# Patient Record
Sex: Female | Born: 1990 | Race: White | Hispanic: No | Marital: Single | Smoking: Former smoker
Health system: Southern US, Community
[De-identification: ages and names within clinical notes are randomized; demographics above are authoritative.]

## PROBLEM LIST (undated history)

## (undated) DIAGNOSIS — I10 Essential (primary) hypertension: Secondary | ICD-10-CM

## (undated) DIAGNOSIS — F909 Attention-deficit hyperactivity disorder, unspecified type: Secondary | ICD-10-CM

## (undated) DIAGNOSIS — N946 Dysmenorrhea, unspecified: Secondary | ICD-10-CM

## (undated) DIAGNOSIS — F419 Anxiety disorder, unspecified: Secondary | ICD-10-CM

## (undated) DIAGNOSIS — T8859XA Other complications of anesthesia, initial encounter: Secondary | ICD-10-CM

## (undated) DIAGNOSIS — J45909 Unspecified asthma, uncomplicated: Secondary | ICD-10-CM

## (undated) DIAGNOSIS — Z793 Long term (current) use of hormonal contraceptives: Secondary | ICD-10-CM

## (undated) DIAGNOSIS — Z9889 Other specified postprocedural states: Secondary | ICD-10-CM

## (undated) DIAGNOSIS — Z87442 Personal history of urinary calculi: Secondary | ICD-10-CM

## (undated) DIAGNOSIS — E349 Endocrine disorder, unspecified: Secondary | ICD-10-CM

## (undated) DIAGNOSIS — F32A Depression, unspecified: Secondary | ICD-10-CM

## (undated) DIAGNOSIS — D649 Anemia, unspecified: Secondary | ICD-10-CM

## (undated) DIAGNOSIS — R112 Nausea with vomiting, unspecified: Secondary | ICD-10-CM

## (undated) HISTORY — DX: Dysmenorrhea, unspecified: N94.6

## (undated) HISTORY — PX: TONSILLECTOMY: SUR1361

## (undated) HISTORY — DX: Attention-deficit hyperactivity disorder, unspecified type: F90.9

## (undated) HISTORY — DX: Endocrine disorder, unspecified: E34.9

## (undated) HISTORY — DX: Long term (current) use of hormonal contraceptives: Z79.3

## (undated) HISTORY — DX: Anxiety disorder, unspecified: F41.9

## (undated) HISTORY — DX: Nausea with vomiting, unspecified: R11.2

---
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
## 2012-04-25 ENCOUNTER — Ambulatory Visit: Payer: Self-pay | Admitting: Unknown Physician Specialty

## 2012-12-27 HISTORY — PX: MENISCUS REPAIR: SHX5179

## 2013-01-05 ENCOUNTER — Ambulatory Visit: Payer: Self-pay | Admitting: Unknown Physician Specialty

## 2013-06-13 ENCOUNTER — Ambulatory Visit: Payer: Self-pay | Admitting: Unknown Physician Specialty

## 2014-02-11 ENCOUNTER — Ambulatory Visit: Payer: Self-pay | Admitting: Internal Medicine

## 2014-08-14 ENCOUNTER — Encounter: Payer: Self-pay | Admitting: Gynecology

## 2014-08-14 ENCOUNTER — Ambulatory Visit (INDEPENDENT_AMBULATORY_CARE_PROVIDER_SITE_OTHER): Payer: BC Managed Care – PPO | Admitting: Gynecology

## 2014-08-14 VITALS — BP 138/84 | HR 80 | Resp 18 | Ht 64.75 in | Wt 129.0 lb

## 2014-08-14 DIAGNOSIS — Z30432 Encounter for removal of intrauterine contraceptive device: Secondary | ICD-10-CM

## 2014-08-14 DIAGNOSIS — N83209 Unspecified ovarian cyst, unspecified side: Secondary | ICD-10-CM

## 2014-08-14 DIAGNOSIS — N390 Urinary tract infection, site not specified: Secondary | ICD-10-CM

## 2014-08-14 DIAGNOSIS — Z3041 Encounter for surveillance of contraceptive pills: Secondary | ICD-10-CM

## 2014-08-14 DIAGNOSIS — R102 Pelvic and perineal pain unspecified side: Secondary | ICD-10-CM | POA: Insufficient documentation

## 2014-08-14 DIAGNOSIS — N949 Unspecified condition associated with female genital organs and menstrual cycle: Secondary | ICD-10-CM

## 2014-08-14 DIAGNOSIS — R6889 Other general symptoms and signs: Secondary | ICD-10-CM

## 2014-08-14 DIAGNOSIS — Z975 Presence of (intrauterine) contraceptive device: Secondary | ICD-10-CM

## 2014-08-14 DIAGNOSIS — Z9189 Other specified personal risk factors, not elsewhere classified: Secondary | ICD-10-CM | POA: Insufficient documentation

## 2014-08-14 DIAGNOSIS — N946 Dysmenorrhea, unspecified: Secondary | ICD-10-CM

## 2014-08-14 DIAGNOSIS — Z79899 Other long term (current) drug therapy: Secondary | ICD-10-CM

## 2014-08-14 DIAGNOSIS — Z113 Encounter for screening for infections with a predominantly sexual mode of transmission: Secondary | ICD-10-CM

## 2014-08-14 DIAGNOSIS — N3000 Acute cystitis without hematuria: Secondary | ICD-10-CM

## 2014-08-14 DIAGNOSIS — Z793 Long term (current) use of hormonal contraceptives: Secondary | ICD-10-CM

## 2014-08-14 DIAGNOSIS — IMO0002 Reserved for concepts with insufficient information to code with codable children: Secondary | ICD-10-CM | POA: Insufficient documentation

## 2014-08-14 DIAGNOSIS — N83299 Other ovarian cyst, unspecified side: Secondary | ICD-10-CM | POA: Insufficient documentation

## 2014-08-14 HISTORY — DX: Dysmenorrhea, unspecified: N94.6

## 2014-08-14 LAB — POCT URINALYSIS DIPSTICK
Blood, UA: 2
Urobilinogen, UA: NEGATIVE
pH, UA: 5

## 2014-08-14 LAB — HEMOGLOBIN, FINGERSTICK: Hemoglobin, fingerstick: 14.9 g/dL (ref 12.0–16.0)

## 2014-08-14 MED ORDER — LEVONORGEST-ETH ESTRAD 91-DAY 0.15-0.03 MG PO TABS: 1.0000 | ORAL_TABLET | Freq: Every day | ORAL | Status: DC

## 2014-08-14 NOTE — Progress Notes (Signed)
23 y.o. Single Caucasian female   G0. here for problem visit. Pt is currently sexually active.  She reports not using condoms on a regular basis.  First sexual activity at 23 years old, 4  number of lifetime partners.   Pt had a Mirena IUD placed after laparoscopy for left cystectomy in 2010, ocp's started at the same time to control bleeding and cyst formation.  Pt reports off ocp her cycles could last for 2w and severe dysmenorrhea.  Pt has been on seasonique and no longer has withdraw bleeds. Pt has had follicular cysts demonstrated by PUS since being on ocp.  Last PUS 04/10/14 due to pain-2 left sided follicles and moderate free fluid.   Pt reports feeling same doubled over pain yesterday, currently on active pills 2nd month.   No dyspareunia.   Pt requests STD screen, current partner 1+y, last screen 7815m ago. Review of records-pap 12/14 ASCUS +HRHPV  Patient's last menstrual period was 08/27/2013.          Sexually active: Yes.    The current method of family planning is OCP (estrogen/progesterone) and IUD.    Exercising: No.   Last pap: 12/12/13 ASCUS + HPV Alcohol: 2 drinks/wk Tobacco: 1 pack/wk Drugs: no Gardisil: yes, completed: Highschool  Hgb: 14.9  ; Urine:  Nitrates +, Leuks 1, Blood 2   There are no preventive care reminders to display for this patient.  Family History  Problem Relation Age of Onset  . Ovarian cancer      Maternal Great Grandmother  . Breast cancer Paternal Grandmother [id number]  . Diabetes Father   . Diabetes Paternal Grandfather   . Diabetes Paternal Uncle   . Endometriosis Mother   . Hypertension Mother   . Hyperthyroidism Father     There are no active problems to display for this patient.   Past Medical History  Diagnosis Date  . Anxiety   . Hormone disorder     Past Surgical History  Procedure Laterality Date  . Meniscus repair  12/2012  . Laparoscopy  2010    cyst removed  . Tonsillectomy  Age 3    Allergies: Review of patient's allergies  indicates no known allergies.  Current Outpatient Prescriptions  Medication Sig Dispense Refill  . citalopram (CELEXA) 20 MG tablet       . DAYSEE 0.15-0.03 &0.01 MG tablet       . lisdexamfetamine (VYVANSE) 30 MG capsule Take by mouth.       No current facility-administered medications for this visit.    ROS: Pertinent items are noted in HPI.  Exam:    BP 138/84  Pulse 80  Resp 18  Ht 5' 4.75" (1.645 m)  Wt 129 lb (58.514 kg)  BMI 21.62 kg/m2  LMP 08/27/2013 Weight change: @WEIGHTCHANGE @ Last 3 height recordings:  Ht Readings from Last 3 Encounters:  08/14/14 5' 4.75" (1.645 m)   General appearance: alert, cooperative and appears stated age Head: Normocephalic, without obvious abnormality, atraumatic Neck: no adenopathy, no carotid bruit, no JVD, supple, symmetrical, trachea midline and thyroid not enlarged, symmetric, no tenderness/mass/nodules Lungs: clear to auscultation bilaterally Heart: regular rate and rhythm, S1, S2 normal, no murmur, click, rub or gallop Abdomen: soft, non-tender; bowel sounds normal; no masses,  no organomegaly Extremities: extremities normal, atraumatic, no cyanosis or edema Skin: Skin color, texture, turgor normal. No rashes or lesions, tan with white spots, multiple piercings Lymph nodes: Cervical, supraclavicular, and axillary nodes normal. no inguinal nodes palpated Neurologic: Grossly normal  Pelvic: External genitalia:  no lesions              Urethra: normal appearing urethra with no masses, tenderness or lesions              Bartholins and Skenes: normal                 Vagina: normal appearing vagina with normal color and discharge, no lesions              Cervix: normal appearance and IUD string visualized              Pap taken: No.        Bimanual Exam:  Uterus:  uterus is normal size, shape, consistency and nontender                                      Adnexa:    normal adnexa in size, nontender and no masses                                       Rectovaginal: Confirms, no nodularity, shortening of USL, tenderness                                      Anus:  normal sphincter tone, no lesions      1. Dysmenorrhea treated with oral contraceptive Pt with long standing dysmenorrhea and menorrhagia since menarche.  Cannot assess if bleeding abnormality is related to immaturity of hypothalamic system or bleeding abnormality.  Based on her history of dysmenorrhea, do not suggest she stop ocp for labwork to be done.  Will continue ocp - levonorgestrel-ethinyl estradiol (SEASONALE,INTROVALE,JOLESSA) 0.15-0.03 MG tablet; Take 1 tablet by mouth daily. No placebos  Dispense: 1 Package; Refill: 4  2. Encounter for IUD removal IUD strings visualized, grasped with ring forceps and removed with gentle traction, pt shown IUD, tolerated well, no bleeding - POCT Urinalysis Dipstick - Hemoglobin, fingerstick  3. IUD (intrauterine device) in place Placed 06/2009, due to be removed, pt agreeable   4. Functional ovarian cysts Pt seems to break through with ovarian cysts and rupture despite being on ocp, she has been on same ocp, she has also had issues with compliance with medication and lastly  Has been on ADHD medications that are liver metabolized, combination of above may not provide adequate ovarian suppression, we will increase estrogen from to and skip placebo week to decrease likelihood for ovarian stimulation. Stressed importance of taking ocp same time and suggest she set alarm on phone.  Pt declines nuvaring trial.  Will repeat PUS based on symptoms last week. Questions addressed  - levonorgestrel-ethinyl estradiol (SEASONALE,INTROVALE,JOLESSA) 0.15-0.03 MG tablet; Take 1 tablet by mouth daily. No placebos  Dispense: 1 Package; Refill: 4 - US Transvaginal Non-OB; Future  5. Pelvic pain in female Discussed that no endometriosis was seen on laparoscopy or surgical pathology, or suggestive on today's exam.  Recommend  continuous suppression with ocp, no dyspareunia - levonorgestrel-ethinyl estradiol (SEASONALE,INTROVALE,JOLESSA) 0.15-0.03 MG tablet; Take 1 tablet by mouth daily. No placebos  Dispense: 1 Package; Refill: 4  6. Encounter for surveillance of contraceptive pills  - levonorgestrel-ethinyl estradiol (SEASONALE,INTROVALE,JOLESSA) 0.15-0.03 MG tablet; Take 1 tablet by mouth daily. No  placebos  Dispense: 1 Package; Refill: 4  7. Screen for STD (sexually transmitted disease)  - HIV antibody - RPR - N. gonorrhoea and Chlamydia by PCR (IPS)  8. Excessive tanning Risks of tanning bed use discussed, pt has been tanning year round since teens.  Risks of melanoma, recommend pt see dermatology.  Pt lives in [place] and works in [place], will make own appt  9.  ASCUS +HPV Pap smear guidelines reviewed, pt informed re her pap, recommend repeat in 1y, due 12/15-HPV affects reviewed  An After Visit Summary was printed and given to the patient.  Over 2m spent reviewing records and counseling >50% face to face

## 2014-08-14 NOTE — Patient Instructions (Signed)
Start new birth control pill Skip placebo pills Set alarm on phone to take regular time Pelvic u/s to evaulate cyst

## 2014-08-14 NOTE — Addendum Note (Signed)
Addended by: Lorraine LaxSHAW, Serine Kea J on: 08/14/2014 02:50 PM   Modules accepted: Orders

## 2014-08-15 LAB — HIV ANTIBODY (ROUTINE TESTING W REFLEX): HIV 1&2 Ab, 4th Generation: NONREACTIVE

## 2014-08-15 LAB — SYPHILIS: RPR W/REFLEX TO RPR TITER AND TREPONEMAL ANTIBODIES, TRADITIONAL SCREENING AND DIAGNOSIS ALGORITHM

## 2014-08-16 LAB — URINE CULTURE: Colony Count: >=100000

## 2014-08-16 LAB — IPS N GONORRHOEA AND CHLAMYDIA BY PCR

## 2014-08-16 MED ORDER — CIPROFLOXACIN HCL 500 MG PO TABS: 500.0000 mg | ORAL_TABLET | Freq: Two times a day (BID) | ORAL | Status: DC

## 2014-08-16 NOTE — Addendum Note (Signed)
Addended by: Douglass RiversLATHROP, Rolondo Pierre on: 08/16/2014 08:18 AM   Modules accepted: Orders

## 2014-08-19 ENCOUNTER — Telehealth: Payer: Self-pay | Admitting: *Deleted

## 2014-08-19 NOTE — Telephone Encounter (Signed)
Patient notified see result note 

## 2014-08-19 NOTE — Telephone Encounter (Signed)
Message copied by Lorraine Lax on Mon Aug 19, 2014  9:14 AM ------      Message from: Douglass Rivers      Created: Fri Aug 16, 2014  4:20 PM       ON CIPRO ------

## 2014-08-19 NOTE — Telephone Encounter (Signed)
Left Message To Call Back  

## 2014-08-20 ENCOUNTER — Telehealth: Payer: Self-pay | Admitting: Gynecology

## 2014-08-20 NOTE — Telephone Encounter (Signed)
Left message for patient to call back. Need to go over benefits and schedule PUS °

## 2014-08-20 NOTE — Telephone Encounter (Signed)
Left message records are available for pick up and will hold for 1 week.

## 2014-09-03 NOTE — Telephone Encounter (Signed)
Left message for patient to call back. Need to go over benefits and schedule PUS °

## 2014-09-16 NOTE — Telephone Encounter (Signed)
Left message for patient to call back. Need to go over benefits and schedule PUS °

## 2014-10-10 ENCOUNTER — Telehealth: Payer: Self-pay | Admitting: Gynecology

## 2014-10-10 NOTE — Telephone Encounter (Signed)
Left message outside records are available for pick up, will hold until 10/17/2014.

## 2014-10-28 ENCOUNTER — Telehealth: Payer: Self-pay | Admitting: Gynecology

## 2014-10-28 NOTE — Telephone Encounter (Signed)
Spoke with patient. Patient states that she has gained 15 pounds in the last two months. "This has never happened to me before." Patient has not started on any other new medications recently. Denies any other symptoms. States that she was previously on seasonique with mirena but due to cyst her mirena was taken out and she was switched to seasonale (See 08/14/14 OV). "I did not have any problems with the seasonique. I do really like not having my period every month though. With the seasonale I have been skipping the sugar pills and starting a new pack. I want to take something that I will be able to do this with." Patient would like to switch OCP at this time due to weight gain. Advised will send messge to covering provider and return call with further information and recommendations. Patient aware it may be tomorrow morning before return call. Patient is agreeable. "That is okay. I still have plenty of pills."  Routing to Dr.Miller

## 2014-10-28 NOTE — Telephone Encounter (Signed)
Pt says she is gaining weight on the birth control and would like to speak with nurse.

## 2014-10-29 MED ORDER — LEVONORGEST-ETH ESTRAD 91-DAY 0.15-0.03 &0.01 MG PO TABS: 1.0000 | ORAL_TABLET | Freq: Every day | ORAL | Status: DC

## 2014-10-29 NOTE — Telephone Encounter (Signed)
Tonya Holt is a 20mcg pill.  OCP changed to seasonale to increase the estrogen (30mcg) which typically decreases cyst/follicle formation.  Only OCPs between the 20 and 30mcg pills are the 25mcg pills and they are all triphasic (cyclessa, ortho tri cyclen, for example).  It is difficult to run these pill packs together (skipping placebo pills) without having break through bleeding.  Options are to [facility] or to stay with a 30mcg pill but to change the progesterone.  My feeling is she was doing pretty well with the seasonique with an occassional cyst and this is better than where she is right now.  I would go back to the [facility].  Can order this for pt if this is what she decides.

## 2014-10-29 NOTE — Telephone Encounter (Signed)
Spoke with patient. Advised patient of message as seen below from Dr.Miller. Patient verbalizes understanding. [facility]. [facility] until [facility]. Patient is agreeable.  Routing to provider for final review. Patient agreeable to disposition. Will close encounter

## 2015-03-07 ENCOUNTER — Encounter: Payer: Self-pay | Admitting: Obstetrics & Gynecology

## 2015-06-09 ENCOUNTER — Other Ambulatory Visit: Payer: Self-pay | Admitting: Unknown Physician Specialty

## 2015-06-09 ENCOUNTER — Ambulatory Visit
Admission: RE | Admit: 2015-06-09 | Discharge: 2015-06-09 | Disposition: A | Discharge status: Home / Self Care | Payer: BLUE CROSS/BLUE SHIELD | Source: Ambulatory Visit | Attending: Unknown Physician Specialty | Admitting: Unknown Physician Specialty

## 2015-06-09 DIAGNOSIS — S6992XA Unspecified injury of left wrist, hand and finger(s), initial encounter: Secondary | ICD-10-CM

## 2015-06-09 DIAGNOSIS — M20012 Mallet finger of left finger(s): Secondary | ICD-10-CM | POA: Insufficient documentation

## 2015-06-09 DIAGNOSIS — S62609A Fracture of unspecified phalanx of unspecified finger, initial encounter for closed fracture: Secondary | ICD-10-CM

## 2015-06-09 DIAGNOSIS — X58XXXA Exposure to other specified factors, initial encounter: Secondary | ICD-10-CM | POA: Diagnosis not present

## 2015-06-09 DIAGNOSIS — S62665A Nondisplaced fracture of distal phalanx of left ring finger, initial encounter for closed fracture: Secondary | ICD-10-CM | POA: Diagnosis not present

## 2015-09-03 ENCOUNTER — Ambulatory Visit: Payer: Self-pay | Admitting: Obstetrics and Gynecology

## 2015-12-08 ENCOUNTER — Other Ambulatory Visit: Payer: Self-pay | Admitting: Internal Medicine

## 2015-12-08 ENCOUNTER — Other Ambulatory Visit: Payer: Self-pay | Admitting: Orthopedic Surgery

## 2015-12-08 DIAGNOSIS — R059 Cough, unspecified: Secondary | ICD-10-CM

## 2015-12-08 DIAGNOSIS — R05 Cough: Secondary | ICD-10-CM

## 2015-12-10 ENCOUNTER — Ambulatory Visit
Admission: RE | Admit: 2015-12-10 | Discharge: 2015-12-10 | Disposition: A | Discharge status: Home / Self Care | Payer: BLUE CROSS/BLUE SHIELD | Source: Ambulatory Visit | Attending: Orthopedic Surgery | Admitting: Orthopedic Surgery

## 2015-12-10 DIAGNOSIS — R05 Cough: Secondary | ICD-10-CM | POA: Insufficient documentation

## 2015-12-10 DIAGNOSIS — R059 Cough, unspecified: Secondary | ICD-10-CM

## 2015-12-31 DIAGNOSIS — I10 Essential (primary) hypertension: Secondary | ICD-10-CM | POA: Insufficient documentation

## 2016-06-03 ENCOUNTER — Encounter: Payer: Self-pay | Admitting: *Deleted

## 2016-06-03 ENCOUNTER — Emergency Department: Payer: BLUE CROSS/BLUE SHIELD

## 2016-06-03 ENCOUNTER — Emergency Department
Admission: EM | Admit: 2016-06-03 | Discharge: 2016-06-03 | Disposition: A | Discharge status: Home / Self Care | Payer: BLUE CROSS/BLUE SHIELD | Attending: Emergency Medicine | Admitting: Emergency Medicine

## 2016-06-03 DIAGNOSIS — W108XXA Fall (on) (from) other stairs and steps, initial encounter: Secondary | ICD-10-CM | POA: Diagnosis not present

## 2016-06-03 DIAGNOSIS — Y929 Unspecified place or not applicable: Secondary | ICD-10-CM | POA: Diagnosis not present

## 2016-06-03 DIAGNOSIS — F17200 Nicotine dependence, unspecified, uncomplicated: Secondary | ICD-10-CM | POA: Diagnosis not present

## 2016-06-03 DIAGNOSIS — Z79899 Other long term (current) drug therapy: Secondary | ICD-10-CM | POA: Insufficient documentation

## 2016-06-03 DIAGNOSIS — S92352A Displaced fracture of fifth metatarsal bone, left foot, initial encounter for closed fracture: Secondary | ICD-10-CM | POA: Diagnosis not present

## 2016-06-03 DIAGNOSIS — S99922A Unspecified injury of left foot, initial encounter: Secondary | ICD-10-CM | POA: Diagnosis present

## 2016-06-03 DIAGNOSIS — F909 Attention-deficit hyperactivity disorder, unspecified type: Secondary | ICD-10-CM | POA: Diagnosis not present

## 2016-06-03 DIAGNOSIS — S92302A Fracture of unspecified metatarsal bone(s), left foot, initial encounter for closed fracture: Secondary | ICD-10-CM

## 2016-06-03 DIAGNOSIS — Y939 Activity, unspecified: Secondary | ICD-10-CM | POA: Insufficient documentation

## 2016-06-03 DIAGNOSIS — Y999 Unspecified external cause status: Secondary | ICD-10-CM | POA: Insufficient documentation

## 2016-06-03 MED ORDER — KETOROLAC TROMETHAMINE 10 MG PO TABS
10.0000 mg | ORAL_TABLET | Freq: Once | ORAL | Status: AC
  Administered 2016-06-03: 10 mg via ORAL
  Filled 2016-06-03: qty 1

## 2016-06-03 MED ORDER — KETOROLAC TROMETHAMINE 10 MG PO TABS: 10.0000 mg | ORAL_TABLET | Freq: Three times a day (TID) | ORAL | Status: DC | PRN

## 2016-06-03 NOTE — ED Provider Notes (Signed)
[facility] Emergency Department Provider Note  ____________________________________________  Time seen: 2:40 AM  I have reviewed the triage vital signs and the nursing notes.   HISTORY  Chief Complaint Foot Injury     HPI Tonya Holt is a 25 y.o. female presents with left foot fifth toe pain status post tripping over a "dog toy. Patient admits to 7 out of 10 pain that is worse with movement of the foot.     Past Medical History  Diagnosis Date  . Anxiety   . Hormone disorder   . ADHD (attention deficit hyperactivity disorder) 2001  . Dysmenorrhea treated with oral contraceptive 08/14/2014    Patient Active Problem List   Diagnosis Date Noted  . Dysmenorrhea treated with oral contraceptive 08/14/2014  . Functional ovarian cysts 08/14/2014  . Pelvic pain in female 08/14/2014  . Excessive tanning 08/14/2014  . ASCUS with positive high risk HPV 08/14/2014    Past Surgical History  Procedure Laterality Date  . Meniscus repair  12/2012  . Laparoscopic ovarian cystectomy Left 2010    follicular cyst, no endometriosis  . Tonsillectomy  Age 62    Current Outpatient Rx  Name  Route  Sig  Dispense  Refill  . ciprofloxacin (CIPRO) 500 MG tablet   Oral   Take 1 tablet (500 mg total) by mouth 2 (two) times daily.   14 tablet   0   . citalopram (CELEXA) 20 MG tablet               . Levonorgestrel-Ethinyl Estradiol (SEASONIQUE) 0.15-0.03 &0.01 MG tablet   Oral   Take 1 tablet by mouth daily.   1 Package   3   . EXPIRED: lisdexamfetamine (VYVANSE) 30 MG capsule   Oral   Take by mouth.           Allergies No known drug allergies  Family History  Problem Relation Age of Onset  . Ovarian cancer      Maternal Great Grandmother  . Breast cancer Paternal Grandmother [id number]  . Diabetes Father   . Diabetes Paternal Grandfather   . Diabetes Paternal Uncle   . Endometriosis Mother   . Hypertension Mother   . Hyperthyroidism Father      Social History Social History  Substance Use Topics  . Smoking status: Current Every Day Smoker  . Smokeless tobacco: Never Used     Comment: 1 Pack/wk  . Alcohol Use: 1.0 oz/week    2 Standard drinks or equivalent per week    Review of Systems  Constitutional: Negative for fever. Eyes: Negative for visual changes. ENT: Negative for sore throat. Cardiovascular: Negative for chest pain. Respiratory: Negative for shortness of breath. Gastrointestinal: Negative for abdominal pain, vomiting and diarrhea. Genitourinary: Negative for dysuria. Musculoskeletal: Negative for back pain. Skin: Negative for rash. Neurological: Negative for headaches, focal weakness or numbness.   10-point ROS otherwise negative.  ____________________________________________   PHYSICAL EXAM:  VITAL SIGNS: ED Triage Vitals  Enc Vitals Group     BP 06/03/16 0203 121/66 mmHg     Pulse Rate 06/03/16 0203 84     Resp 06/03/16 0203 16     Temp 06/03/16 0203 98.5 F (36.9 C)     Temp Source 06/03/16 0203 Oral     SpO2 06/03/16 0203 99 %     Weight 06/03/16 0203 168 lb (76.204 kg)     Height 06/03/16 0203 5\' 4"  (1.626 m)     Head Cir --  Peak Flow --      Pain Score --      Pain Loc --      Pain Edu? --      Excl. in GC? --      Constitutional: Alert and oriented. Well appearing and in no distress. Eyes: Conjunctivae are normal. PERRL. Normal extraocular movements. ENT   Head: Normocephalic and atraumatic.   Nose: No congestion/rhinnorhea.   Mouth/Throat: Mucous membranes are moist.   Neck: No stridor. Hematological/Lymphatic/Immunilogical: No cervical lymphadenopathy. Cardiovascular: Normal rate, regular rhythm. Normal and symmetric distal pulses are present in all extremities. No murmurs, rubs, or gallops. Respiratory: Normal respiratory effort without tachypnea nor retractions. Breath sounds are clear and equal bilaterally. No wheezes/rales/rhonchi. Gastrointestinal:  Soft and nontender. No distention. There is no CVA tenderness. Genitourinary: deferred Musculoskeletal: Left fifth toe swelling at the base pain with active and passive range of motion. No joint effusions.  No lower extremity tenderness nor edema. Neurologic:  Normal speech and language. No gross focal neurologic deficits are appreciated. Speech is normal.  Skin:  Skin is warm, dry and intact. No rash noted.     RADIOLOGY      DG Foot Complete Left (Final result) Result time: 06/03/16 02:38:22   Final result by Rad Results In Interface (06/03/16 02:38:22)   Narrative:   CLINICAL DATA: Fall down 3 steps tonight. Swelling to the left foot. Initial encounter.  EXAM: LEFT FOOT - COMPLETE 3+ VIEW  COMPARISON: None.  FINDINGS: Nondisplaced fifth metatarsal shaft fracture. No dislocation or subluxation.  IMPRESSION: Nondisplaced fifth metatarsal shaft fracture.   Electronically Signed By: Marnee Spring M.D. On: 06/03/2016 02:38        INITIAL IMPRESSION / ASSESSMENT AND PLAN / ED COURSE  Pertinent labs & imaging results that were available during my care of the patient were reviewed by me and considered in my medical decision making (see chart for details).  Patient given Toradol tablet and will be prescribed the same for home  ____________________________________________   FINAL CLINICAL IMPRESSION(S) / ED DIAGNOSES  Final diagnoses:  Fracture of fifth metatarsal bone, left, closed, initial encounter      Darci Current, MD 06/03/16 [phone]

## 2016-06-03 NOTE — ED Notes (Signed)
Ortho shoe and buddy tape applied to right foot.

## 2016-06-03 NOTE — Discharge Instructions (Signed)
Metatarsal Fracture A metatarsal fracture is a break in a metatarsal bone. Metatarsal bones connect your toe bones to your ankle bones. CAUSES This type of fracture may be caused by:  A sudden twisting of your foot.  A fall onto your foot.  Overuse or repetitive exercise. RISK FACTORS This condition is more likely to develop in people who:  Play contact sports.  Have a bone disease.  Have a low calcium level. SYMPTOMS Symptoms of this condition include:  Pain that is worse when walking or standing.  Pain when pressing on the foot or moving the toes.  Swelling.  Bruising on the top or bottom of the foot.  A foot that appears shorter than the other one. DIAGNOSIS This condition is diagnosed with a physical exam. You may also have imaging tests, such as:  X-rays.  A CT scan.  MRI. TREATMENT Treatment for this condition depends on its severity and whether a bone has moved out of place. Treatment may involve:  Rest.  Wearing foot support such as a cast, splint, or boot for several weeks.  Using crutches.  Surgery to move bones back into the right position. Surgery is usually needed if there are many pieces of broken bone or bones that are very out of place (displaced fracture).  Physical therapy. This may be needed to help you regain full movement and strength in your foot. You will need to return to your health care provider to have X-rays taken until your bones heal. Your health care provider will look at the X-rays to make sure that your foot is healing well. HOME CARE INSTRUCTIONS  If You Have a Cast:  Do not stick anything inside the cast to scratch your skin. Doing that increases your risk of infection.  Check the skin around the cast every day. Report any concerns to your health care provider. You may put lotion on dry skin around the edges of the cast. Do not apply lotion to the skin underneath the cast.  Keep the cast clean and dry. If You Have a Splint  or a Supportive Boot:  Wear it as directed by your health care provider. Remove it only as directed by your health care provider.  Loosen it if your toes become numb and tingle, or if they turn cold and blue.  Keep it clean and dry. Bathing  Do not take baths, swim, or use a hot tub until your health care provider approves. Ask your health care provider if you can take showers. You may only be allowed to take sponge baths for bathing.  If your health care provider approves bathing and showering, cover the cast or splint with a watertight plastic bag to protect it from water. Do not let the cast or splint get wet. Managing Pain, Stiffness, and Swelling  If directed, apply ice to the injured area (if you have a splint, not a cast).  Put ice in a plastic bag.  Place a towel between your skin and the bag.  Leave the ice on for 20 minutes, 2-3 times per day.  Move your toes often to avoid stiffness and to lessen swelling.  Raise (elevate) the injured area above the level of your heart while you are sitting or lying down. Driving  Do not drive or operate heavy machinery while taking pain medicine.  Do not drive while wearing foot support on a foot that you use for driving. Activity  Return to your normal activities as directed by your health care   provider. Ask your health care provider what activities are safe for you.  Perform exercises as directed by your health care provider or physical therapist. Safety  Do not use the injured foot to support your body weight until your health care provider says that you can. Use crutches as directed by your health care provider. General Instructions  Do not put pressure on any part of the cast or splint until it is fully hardened. This may take several hours.  Do not use any tobacco products, including cigarettes, chewing tobacco, or e-cigarettes. Tobacco can delay bone healing. If you need help quitting, ask your health care  provider.  Take medicines only as directed by your health care provider.  Keep all follow-up visits as directed by your health care provider. This is important. SEEK MEDICAL CARE IF:  You have a fever.  Your cast, splint, or boot is too loose or too tight.  Your cast, splint, or boot is damaged.  Your pain medicine is not helping.  You have pain, tingling, or numbness in your foot that is not going away. SEEK IMMEDIATE MEDICAL CARE IF:  You have severe pain.  You have tingling or numbness in your foot that is getting worse.  Your foot feels cold or becomes numb.  Your foot changes color.   This information is not intended to replace advice given to you by your health care provider. Make sure you discuss any questions you have with your health care provider.   Document Released: 09/04/2002 Document Revised: 04/29/2015 Document Reviewed: 10/09/2014 Elsevier Interactive Patient Education 2016 [employer]  

## 2016-06-03 NOTE — ED Notes (Signed)
Pt tripped over a dog toy and fell down 3 steps tonight.  Pt has swelling to left foot.  Denies other injury

## 2016-06-08 ENCOUNTER — Encounter: Payer: Self-pay | Admitting: Podiatry

## 2016-06-08 ENCOUNTER — Ambulatory Visit: Payer: Self-pay

## 2016-06-08 ENCOUNTER — Ambulatory Visit (INDEPENDENT_AMBULATORY_CARE_PROVIDER_SITE_OTHER): Payer: BLUE CROSS/BLUE SHIELD | Admitting: Podiatry

## 2016-06-08 VITALS — BP 121/73 | HR 85 | Resp 12

## 2016-06-08 DIAGNOSIS — R52 Pain, unspecified: Secondary | ICD-10-CM

## 2016-06-08 DIAGNOSIS — S92302A Fracture of unspecified metatarsal bone(s), left foot, initial encounter for closed fracture: Secondary | ICD-10-CM

## 2016-06-08 NOTE — Progress Notes (Signed)
   Subjective:    Patient ID: Tonya BrooksLindsey N Holt, female    DOB: 08-25-1991, 25 y.o.   MRN: [id number]  HPIThis patient presents to the office with painful outside left foot.  She says she tripped down stairs and [facility]  She says x-rays were taken and she was diagnosed with fracture.  They dispensed her a wooden shoe.  She says the pain has continued in her left foot but her swollen whole foot has gone down.  She presnets to the office for evaluation and treatment.    Review of Systems  All other systems reviewed and are negative.      Objective:   Physical Exam GENERAL APPEARANCE: Alert, conversant. Appropriately groomed. No acute distress.  VASCULAR: Pedal pulses are  palpable at  [facility] and PT bilateral.  Capillary refill time is immediate to all digits,  Normal temperature gradient.  Digital hair growth is present bilateral  NEUROLOGIC: sensation is normal to 5.07 monofilament at 5/5 sites bilateral.  Light touch is intact bilateral, Muscle strength normal.  MUSCULOSKELETAL: acceptable muscle strength, tone and stability bilateral.  Intrinsic muscluature intact bilateral.  Rectus appearance of foot and digits noted bilateral. Palpable pain and swelling over fifth metatarsal left foot.  DERMATOLOGIC: skin color, texture, and turgor are within normal limits.  No preulcerative lesions or ulcers  are seen, no interdigital maceration noted.  No open lesions present.  Digital nails are asymptomatic. No drainage noted.         Assessment & Plan:  Closed fifth metatarsdal fracture left foot.   IE  Cam walker to be dispensed.  RTC 3 weeks for follow up xray.  Her oblique x-ray re veals non-displaced fracture.   Helane GuntherGregory Danyael Alipio DPM

## 2016-07-02 ENCOUNTER — Ambulatory Visit: Payer: BLUE CROSS/BLUE SHIELD | Admitting: Podiatry

## 2017-08-03 DIAGNOSIS — M25521 Pain in right elbow: Secondary | ICD-10-CM | POA: Insufficient documentation

## 2017-08-03 DIAGNOSIS — G5601 Carpal tunnel syndrome, right upper limb: Secondary | ICD-10-CM | POA: Insufficient documentation

## 2017-08-03 DIAGNOSIS — M7711 Lateral epicondylitis, right elbow: Secondary | ICD-10-CM | POA: Insufficient documentation

## 2017-10-26 DIAGNOSIS — M25562 Pain in left knee: Secondary | ICD-10-CM | POA: Insufficient documentation

## 2017-11-04 ENCOUNTER — Other Ambulatory Visit: Payer: Self-pay | Admitting: Orthopedic Surgery

## 2017-11-04 DIAGNOSIS — M25562 Pain in left knee: Secondary | ICD-10-CM

## 2017-11-08 ENCOUNTER — Ambulatory Visit
Admission: RE | Admit: 2017-11-08 | Discharge: 2017-11-08 | Disposition: A | Discharge status: Home / Self Care | Payer: BLUE CROSS/BLUE SHIELD | Source: Ambulatory Visit | Attending: Orthopedic Surgery | Admitting: Orthopedic Surgery

## 2017-11-08 ENCOUNTER — Encounter (INDEPENDENT_AMBULATORY_CARE_PROVIDER_SITE_OTHER): Payer: Self-pay

## 2017-11-08 DIAGNOSIS — M25562 Pain in left knee: Secondary | ICD-10-CM | POA: Insufficient documentation

## 2018-01-26 ENCOUNTER — Other Ambulatory Visit: Payer: Self-pay

## 2018-01-26 ENCOUNTER — Encounter: Payer: Self-pay | Admitting: Emergency Medicine

## 2018-01-26 ENCOUNTER — Ambulatory Visit
Admission: EM | Admit: 2018-01-26 | Discharge: 2018-01-26 | Disposition: A | Discharge status: Home / Self Care | Payer: BLUE CROSS/BLUE SHIELD | Attending: Family Medicine | Admitting: Family Medicine

## 2018-01-26 DIAGNOSIS — W268XXA Contact with other sharp object(s), not elsewhere classified, initial encounter: Secondary | ICD-10-CM | POA: Diagnosis not present

## 2018-01-26 DIAGNOSIS — S81802A Unspecified open wound, left lower leg, initial encounter: Secondary | ICD-10-CM

## 2018-01-26 DIAGNOSIS — L03116 Cellulitis of left lower limb: Secondary | ICD-10-CM | POA: Diagnosis not present

## 2018-01-26 MED ORDER — SULFAMETHOXAZOLE-TRIMETHOPRIM 800-160 MG PO TABS: 1.0000 | ORAL_TABLET | Freq: Two times a day (BID) | ORAL | 0 refills | Status: AC

## 2018-01-26 MED ORDER — MUPIROCIN 2 % EX OINT: TOPICAL_OINTMENT | CUTANEOUS | 0 refills | Status: DC

## 2018-01-26 NOTE — Discharge Instructions (Signed)
Take medication as prescribed. Rest. Drink plenty of fluids.  ° °Follow up with your primary care physician this week as needed. Return to Urgent care for new or worsening concerns.  ° °

## 2018-01-26 NOTE — ED Provider Notes (Signed)
[facility] ____________________________________________  Time seen: Approximately 5:33 PM  I have reviewed the triage vital signs and the nursing notes.   HISTORY  Chief Complaint Wound Infection   HPI Tonya Holt is a 27 y.o. female presenting for evaluation of wound to left lateral leg from shaving.  Patient reports this past weekend she accidentally shaved too hard and shaved a piece of skin off.  Patient reports that she did not think too much about it, but reports it became itchy and she has been scratching it and feel like this worsened it.  States has been cleaning with hydrogen peroxide.  States for the last few days it has appeared red.  States mildly tender, no other pain or change in skin.  Denies known history of MRSA.  Reports tetanus immunization is up-to-date.  No other alleviating measures attempted.  Denies other active as well.  Denies fevers. Denies recent sickness. Denies recent antibiotic use.   No LMP recorded. Patient is not currently having periods (Reason: IUD).Denies pregnancy. Marguarite Arbour, MD: PCP   Past Medical History:  Diagnosis Date  . ADHD (attention deficit hyperactivity disorder) 2001  . Anxiety   . Dysmenorrhea treated with oral contraceptive 08/14/2014  . Hormone disorder     Patient Active Problem List   Diagnosis Date Noted  . Dysmenorrhea treated with oral contraceptive 08/14/2014  . Functional ovarian cysts 08/14/2014  . Pelvic pain in female 08/14/2014  . Excessive tanning 08/14/2014  . ASCUS with positive high risk HPV 08/14/2014    Past Surgical History:  Procedure Laterality Date  . LAPAROSCOPIC OVARIAN CYSTECTOMY Left 2010   follicular cyst, no endometriosis  . MENISCUS REPAIR  12/2012  . TONSILLECTOMY  Age 34     No current facility-administered medications for this encounter.   Current Outpatient Medications:  .  lisdexamfetamine (VYVANSE) 30 MG capsule, Take by mouth., Disp: , Rfl:  .   losartan-hydrochlorothiazide (HYZAAR) 100-25 MG tablet, Take 1 tablet by mouth daily., Disp: , Rfl:  .  venlafaxine XR (EFFEXOR-XR) 75 MG 24 hr capsule, Take 75 mg by mouth daily with breakfast., Disp: , Rfl:  .  mupirocin ointment (BACTROBAN) 2 %, Apply two times a day for 7 days., Disp: 22 g, Rfl: 0 .  sulfamethoxazole-trimethoprim (BACTRIM DS,SEPTRA DS) 800-160 MG tablet, Take 1 tablet by mouth 2 (two) times daily for 7 days., Disp: 14 tablet, Rfl: 0  Allergies Iodinated diagnostic agents and Latex  Family History  Problem Relation Age of Onset  . Ovarian cancer Unknown        Maternal Great Grandmother  . Breast cancer Paternal Grandmother 62  . Diabetes Father   . Hyperthyroidism Father   . Diabetes Paternal Grandfather   . Diabetes Paternal Uncle   . Endometriosis Mother   . Hypertension Mother     Social History Social History   Tobacco Use  . Smoking status: Former Smoker    Last attempt to quit: 02/2017    Years since quitting: 0.9  . Smokeless tobacco: Never Used  . Tobacco comment: 1 Pack/wk  Substance Use Topics  . Alcohol use: Yes    Alcohol/week: 1.0 oz    Types: 2 Standard drinks or equivalent per week  . Drug use: No    Review of Systems Constitutional: No fever/chills Cardiovascular: Denies chest pain. Respiratory: Denies shortness of breath. Gastrointestinal: No abdominal pain.  Musculoskeletal: Negative for back pain. Skin: As above.    ____________________________________________   PHYSICAL EXAM:  VITAL  SIGNS: ED Triage Vitals  Enc Vitals Group     BP 01/26/18 1656 121/69     Pulse Rate 01/26/18 1656 (!) 115     Resp 01/26/18 1656 16     Temp 01/26/18 1656 98.3 F (36.8 C)     Temp Source 01/26/18 1656 Oral     SpO2 01/26/18 1656 100 %     Weight 01/26/18 1655 158 lb (71.7 kg)     Height 01/26/18 1655 5\' 3"  (1.6 m)     Head Circumference --      Peak Flow --      Pain Score 01/26/18 1656 6     Pain Loc --      Pain Edu? --       Excl. in GC? --     Constitutional: Alert and oriented. Well appearing and in no acute distress. Cardiovascular: Normal rate, regular rhythm. Grossly normal heart sounds.  Good peripheral circulation. Respiratory: Normal respiratory effort without tachypnea nor retractions. Breath sounds are clear and equal bilaterally. No wheezes, rales, rhonchi. Musculoskeletal:  Steady gait.  Bilateral pedal pulses equal and easily palpated. Neurologic:  Normal speech and language. No gross focal neurologic deficits are appreciated. Speech is normal. No gait instability.  Skin:  Skin is warm, dry. Except: Right lateral proximal calf 2 adjacent areas approximately 1 cm each superficial skin avulsion with slight palpated induration, no fluctuance, minimal immediately surrounding erythema, slight serous fluid drainage, minimally tenderness, no surrounding tenderness, no calf tenderness, left lower extremity otherwise nontender. Psychiatric: Mood and affect are normal. Speech and behavior are normal. Patient exhibits appropriate insight and judgment   ___________________________________________   LABS (all labs ordered are listed, but only abnormal results are displayed)  Labs Reviewed  AEROBIC CULTURE (SUPERFICIAL SPECIMEN)    PROCEDURES Procedures    INITIAL IMPRESSION / ASSESSMENT AND PLAN / ED COURSE  Pertinent labs & imaging results that were available during my care of the patient were reviewed by me and considered in my medical decision making (see chart for details).  Well-appearing patient.  No acute distress.  Accidentally cut self when shaving.  Tetanus is up-to-date.  Appears to have secondary mild cellulitis.  Will empirically treat with oral Bactrim and topical Bactroban. Wound culture obtained.  Encouraged open to air as is moist and needs to dry out.  Discussed cleaning and monitoring.  Careful with shaving.  Encourage supportive care.Discussed indication, risks and benefits of  medications with patient. Discussed follow up and return parameters including no resolution or any worsening concerns. Patient verbalized understanding and agreed to plan.   ____________________________________________   FINAL CLINICAL IMPRESSION(S) / ED DIAGNOSES  Final diagnoses:  Wound of left lower extremity, initial encounter  Left leg cellulitis     ED Discharge Orders        Ordered    sulfamethoxazole-trimethoprim (BACTRIM DS,SEPTRA DS) 800-160 MG tablet  2 times daily     01/26/18 1740    mupirocin ointment (BACTROBAN) 2 %     01/26/18 1740       Note: This dictation was prepared with Dragon dictation along with smaller phrase technology. Any transcriptional errors that result from this process are unintentional.         Renford DillsMiller, Rema, NP 01/26/18 [phone]

## 2018-01-26 NOTE — ED Triage Notes (Signed)
Patient in today with a wound on her left lower leg. Patient cut herself shaving ~6 days ago and wound has gotten red and swollen. Patient has been using Neosporin and Hydrogen Peroxide.

## 2018-01-29 LAB — AEROBIC CULTURE W GRAM STAIN (SUPERFICIAL SPECIMEN)
Culture: NO GROWTH
Gram Stain: NONE SEEN
Special Requests: NORMAL

## 2018-03-23 ENCOUNTER — Other Ambulatory Visit: Payer: Self-pay

## 2018-03-23 ENCOUNTER — Ambulatory Visit
Admission: EM | Admit: 2018-03-23 | Discharge: 2018-03-23 | Disposition: A | Discharge status: Home / Self Care | Payer: BLUE CROSS/BLUE SHIELD | Attending: Family Medicine | Admitting: Family Medicine

## 2018-03-23 DIAGNOSIS — J069 Acute upper respiratory infection, unspecified: Secondary | ICD-10-CM | POA: Diagnosis not present

## 2018-03-23 MED ORDER — FLUTICASONE PROPIONATE 50 MCG/ACT NA SUSP: 2.0000 | Freq: Every day | NASAL | 0 refills | Status: DC

## 2018-03-23 MED ORDER — HYDROCOD POLST-CPM POLST ER 10-8 MG/5ML PO SUER: 5.0000 mL | Freq: Two times a day (BID) | ORAL | 0 refills | Status: DC

## 2018-03-23 MED ORDER — BENZONATATE 200 MG PO CAPS: ORAL_CAPSULE | ORAL | 0 refills | Status: DC

## 2018-03-23 NOTE — ED Provider Notes (Signed)
[facility]    CSN: [id number] Arrival date & time: 03/23/18  1144     History   Chief Complaint Chief Complaint  Patient presents with  . Facial Pain    APPT  . Otalgia    HPI Tonya Holt is a 27 y.o. female.   HPI  27 year old female presents with a 4-day history of sinus congestion sore throat and ear pain and pressure.  She thought at first it may be allergies and started taking Claritin but has not improved.  She states she is very congested at nighttime and has difficulty breathing when lying down.  Is been hot and cold but has never measured her temperature.  She is afebrile today.  O2 sats on room air is 100%.        Past Medical History:  Diagnosis Date  . ADHD (attention deficit hyperactivity disorder) 2001  . Anxiety   . Dysmenorrhea treated with oral contraceptive 08/14/2014  . Hormone disorder     Patient Active Problem List   Diagnosis Date Noted  . Dysmenorrhea treated with oral contraceptive 08/14/2014  . Functional ovarian cysts 08/14/2014  . Pelvic pain in female 08/14/2014  . Excessive tanning 08/14/2014  . ASCUS with positive high risk HPV 08/14/2014    Past Surgical History:  Procedure Laterality Date  . LAPAROSCOPIC OVARIAN CYSTECTOMY Left 2010   follicular cyst, no endometriosis  . MENISCUS REPAIR  12/2012  . TONSILLECTOMY  Age 89    OB History    Gravida  0   Para  0   Term  0   Preterm  0   AB  0   Living  0     SAB  0   TAB  0   Ectopic  0   Multiple  0   Live Births               Home Medications    Prior to Admission medications   Medication Sig Start Date End Date Taking? Authorizing Provider  amitriptyline (ELAVIL) 10 MG tablet Take 10 mg by mouth at bedtime.   Yes [provider]  levonorgestrel (MIRENA) 20 MCG/24HR IUD 1 each by Intrauterine route once.   Yes [provider]  benzonatate (TESSALON) 200 MG capsule Take one cap TID PRN cough 03/23/18   Lutricia Feiloemer, Timika Muench  P, PA-C  chlorpheniramine-HYDROcodone [facility](TUSSIONEX PENNKINETIC ER) 10-8 MG/5ML SUER Take 5 mLs by mouth 2 (two) times daily. 03/23/18   Lutricia Feiloemer, Atleigh Gruen P, PA-C  fluticasone (FLONASE) 50 MCG/ACT nasal spray Place 2 sprays into both nostrils daily. 03/23/18   Lutricia Feiloemer, Shamarion Coots P, PA-C  lisdexamfetamine (VYVANSE) 30 MG capsule Take by mouth.    [provider]  losartan-hydrochlorothiazide (HYZAAR) 100-25 MG tablet Take 1 tablet by mouth daily.    [provider]  venlafaxine XR (EFFEXOR-XR) 75 MG 24 hr capsule Take 75 mg by mouth daily with breakfast.    [provider]    Family History Family History  Problem Relation Age of Onset  . Ovarian cancer Unknown        Maternal Great Grandmother  . Breast cancer Paternal Grandmother [id number]  . Diabetes Father   . Hyperthyroidism Father   . Diabetes Paternal Grandfather   . Diabetes Paternal Uncle   . Endometriosis Mother   . Hypertension Mother     Social History Social History   Tobacco Use  . Smoking status: Former Smoker    Last attempt to quit: 02/2017  Years since quitting: 1.0  . Smokeless tobacco: Never Used  . Tobacco comment: 1 Pack/wk  Substance Use Topics  . Alcohol use: Yes    Alcohol/week: 1.0 oz    Types: 2 Standard drinks or equivalent per week  . Drug use: No     Allergies   Iodinated diagnostic agents and Latex   Review of Systems Review of Systems  Constitutional: Positive for activity change, chills and fatigue.  HENT: Positive for congestion, postnasal drip, rhinorrhea, sinus pressure, sinus pain and sore throat.   Respiratory: Positive for cough and shortness of breath.   All other systems reviewed and are negative.    Physical Exam Triage Vital Signs ED Triage Vitals  Enc Vitals Group     BP 03/23/18 1156 126/82     Pulse Rate 03/23/18 1156 95     Resp 03/23/18 1156 18     Temp 03/23/18 1156 98.5 F (36.9 C)     Temp Source 03/23/18 1156 Oral     SpO2 03/23/18 1156 100 %       Weight 03/23/18 1158 155 lb (70.3 kg)     Height 03/23/18 1158 5\' 3"  (1.6 m)     Head Circumference --      Peak Flow --      Pain Score 03/23/18 1158 5     Pain Loc --      Pain Edu? --      Excl. in GC? --    No data found.  Updated Vital Signs BP 126/82 (BP Location: Right Arm)   Pulse 95   Temp 98.5 F (36.9 C) (Oral)   Resp 18   Ht 5\' 3"  (1.6 m)   Wt 155 lb (70.3 kg)   SpO2 100%   BMI 27.46 kg/m   Visual Acuity Right Eye Distance:   Left Eye Distance:   Bilateral Distance:    Right Eye Near:   Left Eye Near:    Bilateral Near:     Physical Exam  Constitutional: She is oriented to person, place, and time. She appears well-developed and well-nourished. No distress.  HENT:  Head: Normocephalic and atraumatic.  Right Ear: External ear normal.  Left Ear: External ear normal.  Nose: Nose normal.  Mouth/Throat: Oropharynx is clear and moist. No oropharyngeal exudate.  Both TMs appear dull  Eyes: Pupils are equal, round, and reactive to light. Right eye exhibits no discharge. Left eye exhibits no discharge.  Neck: Normal range of motion.  Pulmonary/Chest: Effort normal and breath sounds normal.  Musculoskeletal: Normal range of motion.  Lymphadenopathy:    She has no cervical adenopathy.  Neurological: She is alert and oriented to person, place, and time.  Skin: Skin is warm and dry. She is not diaphoretic.  Psychiatric: She has a normal mood and affect. Her behavior is normal. Judgment and thought content normal.  Nursing note and vitals reviewed.    UC Treatments / Results  Labs (all labs ordered are listed, but only abnormal results are displayed) Labs Reviewed - No data to display  EKG None Radiology No results found.  Procedures Procedures (including critical care time)  Medications Ordered in UC Medications - No data to display   Initial Impression / Assessment and Plan / UC Course  I have reviewed the triage vital signs and the nursing  notes.  Pertinent labs & imaging results that were available during my care of the patient were reviewed by me and considered in my medical decision making (see  chart for details).     Plan: 1. Test/x-ray results and diagnosis reviewed with patient 2. rx as per orders; risks, benefits, potential side effects reviewed with patient 3. Recommend supportive treatment with new use of Claritin and Flonase throughout the spring season.  Treat symptomatically for her cough.  Told her this is a viral illness does not require antibiotics at this time.  She is not improving she should follow-up with her primary care physician 4. F/u prn if symptoms worsen or don't improve   Final Clinical Impressions(s) / UC Diagnoses   Final diagnoses:  Upper respiratory tract infection, unspecified type    ED Discharge Orders        Ordered    benzonatate (TESSALON) 200 MG capsule     03/23/18 1315    chlorpheniramine-HYDROcodone (TUSSIONEX PENNKINETIC ER) 10-8 MG/5ML SUER  2 times daily     03/23/18 1315    fluticasone (FLONASE) 50 MCG/ACT nasal spray  Daily     03/23/18 1315       Controlled Substance Prescriptions [place] Controlled Substance Registry consulted? Not Applicable   Lutricia Feil, PA-C 03/23/18 1323

## 2018-03-23 NOTE — Discharge Instructions (Addendum)
Afrin for 2 days maximum to help with sinus congestion.  Use Flonase at the same time continue using the Flonase throughout the spring time.  Use Claritin as we discussed.

## 2018-03-23 NOTE — ED Triage Notes (Signed)
Pt with sinus congestion, sore throat, ears feel full have pressure.

## 2019-01-26 ENCOUNTER — Other Ambulatory Visit: Payer: Self-pay | Admitting: Family Medicine

## 2019-01-26 DIAGNOSIS — R1310 Dysphagia, unspecified: Secondary | ICD-10-CM

## 2019-03-19 ENCOUNTER — Telehealth: Payer: Self-pay | Admitting: Radiology

## 2019-03-19 NOTE — Telephone Encounter (Signed)
Left message for patient to call [id number] to reschedule Annual exam to a later date due to COVID-19

## 2019-03-21 ENCOUNTER — Encounter: Payer: BLUE CROSS/BLUE SHIELD | Admitting: Family Medicine

## 2020-09-03 ENCOUNTER — Encounter: Payer: Self-pay | Admitting: Emergency Medicine

## 2020-09-03 ENCOUNTER — Emergency Department: Payer: Self-pay

## 2020-09-03 DIAGNOSIS — R0602 Shortness of breath: Secondary | ICD-10-CM | POA: Insufficient documentation

## 2020-09-03 DIAGNOSIS — Z8616 Personal history of COVID-19: Secondary | ICD-10-CM | POA: Insufficient documentation

## 2020-09-03 DIAGNOSIS — R079 Chest pain, unspecified: Secondary | ICD-10-CM | POA: Insufficient documentation

## 2020-09-03 DIAGNOSIS — Z5321 Procedure and treatment not carried out due to patient leaving prior to being seen by health care provider: Secondary | ICD-10-CM | POA: Insufficient documentation

## 2020-09-03 LAB — TROPONIN I (HIGH SENSITIVITY): Troponin I (High Sensitivity): 5 ng/L (ref <18)

## 2020-09-03 LAB — BASIC METABOLIC PANEL
Anion gap: 11 (ref 5–15)
BUN: 9 mg/dL (ref 6–20)
CO2: 24 mmol/L (ref 22–32)
Calcium: 8.3 mg/dL — ABNORMAL LOW (ref 8.9–10.3)
Chloride: 106 mmol/L (ref 98–111)
Creatinine, Ser: 1 mg/dL (ref 0.44–1.00)
GFR calc Af Amer: >60 mL/min (ref >60)
GFR calc non Af Amer: >60 mL/min (ref >60)
Glucose, Bld: 118 mg/dL — ABNORMAL HIGH (ref 70–99)
Potassium: 3.7 mmol/L (ref 3.5–5.1)
Sodium: 141 mmol/L (ref 135–145)

## 2020-09-03 LAB — CBC
HCT: 40.3 % (ref 36.0–46.0)
Hemoglobin: 14.6 g/dL (ref 12.0–15.0)
MCH: 35.8 pg — ABNORMAL HIGH (ref 26.0–34.0)
MCHC: 36.2 g/dL — ABNORMAL HIGH (ref 30.0–36.0)
MCV: 98.8 fL (ref 80.0–100.0)
Platelets: 181 10*3/uL (ref 150–400)
RBC: 4.08 MIL/uL (ref 3.87–5.11)
RDW: 11.8 % (ref 11.5–15.5)
WBC: 5.5 10*3/uL (ref 4.0–10.5)
nRBC: 0 % (ref 0.0–0.2)

## 2020-09-03 NOTE — ED Triage Notes (Signed)
Pt arrived via EMS from home where she called out due to SOB and chest pain x5 days. Pt reports having COVID in February. Pt denies fever.

## 2020-09-04 ENCOUNTER — Emergency Department
Admission: EM | Admit: 2020-09-04 | Discharge: 2020-09-04 | Disposition: A | Discharge status: Home / Self Care | Payer: Self-pay | Attending: Emergency Medicine | Admitting: Emergency Medicine

## 2020-09-04 LAB — TROPONIN I (HIGH SENSITIVITY): Troponin I (High Sensitivity): 5 ng/L (ref <18)

## 2020-09-05 ENCOUNTER — Other Ambulatory Visit: Payer: Self-pay | Admitting: Internal Medicine

## 2020-09-05 DIAGNOSIS — R1030 Lower abdominal pain, unspecified: Secondary | ICD-10-CM

## 2020-09-11 ENCOUNTER — Other Ambulatory Visit: Payer: Self-pay

## 2020-09-11 ENCOUNTER — Ambulatory Visit
Admission: RE | Admit: 2020-09-11 | Discharge: 2020-09-11 | Disposition: A | Discharge status: Home / Self Care | Payer: BC Managed Care – PPO | Source: Ambulatory Visit | Attending: Internal Medicine | Admitting: Internal Medicine

## 2020-09-11 DIAGNOSIS — R1030 Lower abdominal pain, unspecified: Secondary | ICD-10-CM | POA: Insufficient documentation

## 2020-09-18 ENCOUNTER — Other Ambulatory Visit: Payer: Self-pay

## 2020-09-18 DIAGNOSIS — R519 Headache, unspecified: Secondary | ICD-10-CM | POA: Diagnosis not present

## 2020-09-18 DIAGNOSIS — R55 Syncope and collapse: Secondary | ICD-10-CM | POA: Insufficient documentation

## 2020-09-18 DIAGNOSIS — Z5321 Procedure and treatment not carried out due to patient leaving prior to being seen by health care provider: Secondary | ICD-10-CM | POA: Insufficient documentation

## 2020-09-18 LAB — URINE DRUG SCREEN, QUALITATIVE (ARMC ONLY)
Amphetamines, Ur Screen: NOT DETECTED
Barbiturates, Ur Screen: NOT DETECTED
Benzodiazepine, Ur Scrn: NOT DETECTED
Cannabinoid 50 Ng, Ur ~~LOC~~: NOT DETECTED
Cocaine Metabolite,Ur ~~LOC~~: NOT DETECTED
MDMA (Ecstasy)Ur Screen: NOT DETECTED
Methadone Scn, Ur: NOT DETECTED
Opiate, Ur Screen: NOT DETECTED
Phencyclidine (PCP) Ur S: NOT DETECTED
Tricyclic, Ur Screen: NOT DETECTED

## 2020-09-18 LAB — COMPREHENSIVE METABOLIC PANEL
ALT: 71 U/L — ABNORMAL HIGH (ref 0–44)
AST: 169 U/L — ABNORMAL HIGH (ref 15–41)
Albumin: 4.5 g/dL (ref 3.5–5.0)
Alkaline Phosphatase: 101 U/L (ref 38–126)
Anion gap: 16 — ABNORMAL HIGH (ref 5–15)
BUN: <5 mg/dL — ABNORMAL LOW (ref 6–20)
CO2: 24 mmol/L (ref 22–32)
Calcium: 8.8 mg/dL — ABNORMAL LOW (ref 8.9–10.3)
Chloride: 102 mmol/L (ref 98–111)
Creatinine, Ser: 0.6 mg/dL (ref 0.44–1.00)
GFR calc Af Amer: >60 mL/min (ref >60)
GFR calc non Af Amer: >60 mL/min (ref >60)
Glucose, Bld: 102 mg/dL — ABNORMAL HIGH (ref 70–99)
Potassium: 3.5 mmol/L (ref 3.5–5.1)
Sodium: 142 mmol/L (ref 135–145)
Total Bilirubin: 0.8 mg/dL (ref 0.3–1.2)
Total Protein: 7.5 g/dL (ref 6.5–8.1)

## 2020-09-18 LAB — CBC
HCT: 42.4 % (ref 36.0–46.0)
Hemoglobin: 15.2 g/dL — ABNORMAL HIGH (ref 12.0–15.0)
MCH: 35.2 pg — ABNORMAL HIGH (ref 26.0–34.0)
MCHC: 35.8 g/dL (ref 30.0–36.0)
MCV: 98.1 fL (ref 80.0–100.0)
Platelets: 119 10*3/uL — ABNORMAL LOW (ref 150–400)
RBC: 4.32 MIL/uL (ref 3.87–5.11)
RDW: 11.9 % (ref 11.5–15.5)
WBC: 4 10*3/uL (ref 4.0–10.5)
nRBC: 0 % (ref 0.0–0.2)

## 2020-09-18 LAB — URINALYSIS, COMPLETE (UACMP) WITH MICROSCOPIC
Bacteria, UA: NONE SEEN
Bilirubin Urine: NEGATIVE
Glucose, UA: NEGATIVE mg/dL
Ketones, ur: NEGATIVE mg/dL
Leukocytes,Ua: NEGATIVE
Nitrite: NEGATIVE
Protein, ur: NEGATIVE mg/dL
Specific Gravity, Urine: 1.003 — ABNORMAL LOW (ref 1.005–1.030)
pH: 7 (ref 5.0–8.0)

## 2020-09-18 LAB — POCT PREGNANCY, URINE: Preg Test, Ur: NEGATIVE

## 2020-09-18 LAB — TROPONIN I (HIGH SENSITIVITY): Troponin I (High Sensitivity): 6 ng/L (ref <18)

## 2020-09-18 LAB — ETHANOL: Alcohol, Ethyl (B): 284 mg/dL — ABNORMAL HIGH (ref <10)

## 2020-09-18 NOTE — ED Notes (Addendum)
Pt unarousable during the triage process.  Wakens with sternal rub.  Pt wheeled to bathroom for UA sample.  Able to get up and give sample.  Wheeled back to lobby, and pt demanded I place her near an outlet so she can plug her phone in.  Pt got up, plugged her phone in, and began taking selfies of herself and texting.  Pt is very alert and orientated at this time

## 2020-09-18 NOTE — ED Notes (Signed)
First Nurse Note: Pt to ED via [facility] for syncopal episode. Pt reports hitting head and having headache. Pt took double dose of her Klonopin this morning. Pt is A & O.   BP: 146/102 HR:120 97% on room air 98.1

## 2020-09-18 NOTE — ED Triage Notes (Signed)
Pt in with co syncope states has had same for the last 2 weeks. Has a holter monitor on for the same per cardiologist, has an apptm with neurologist for the same. Here for 2 episodes of syncope today, no injuries from fall today.

## 2020-09-19 ENCOUNTER — Emergency Department
Admission: EM | Admit: 2020-09-19 | Discharge: 2020-09-19 | Disposition: A | Discharge status: Home / Self Care | Payer: BC Managed Care – PPO | Attending: Emergency Medicine | Admitting: Emergency Medicine

## 2020-09-19 DIAGNOSIS — F411 Generalized anxiety disorder: Secondary | ICD-10-CM | POA: Insufficient documentation

## 2020-09-19 DIAGNOSIS — R569 Unspecified convulsions: Secondary | ICD-10-CM | POA: Insufficient documentation

## 2020-09-19 DIAGNOSIS — R4020 Unspecified coma: Secondary | ICD-10-CM | POA: Insufficient documentation

## 2020-09-19 DIAGNOSIS — R112 Nausea with vomiting, unspecified: Secondary | ICD-10-CM | POA: Insufficient documentation

## 2020-09-19 DIAGNOSIS — R000 Tachycardia, unspecified: Secondary | ICD-10-CM | POA: Insufficient documentation

## 2020-09-19 DIAGNOSIS — R079 Chest pain, unspecified: Secondary | ICD-10-CM | POA: Insufficient documentation

## 2020-10-16 ENCOUNTER — Ambulatory Visit
Admission: RE | Admit: 2020-10-16 | Discharge: 2020-10-16 | Disposition: A | Discharge status: Home / Self Care | Payer: BC Managed Care – PPO | Source: Ambulatory Visit | Attending: Physician Assistant | Admitting: Physician Assistant

## 2020-10-16 ENCOUNTER — Other Ambulatory Visit: Payer: Self-pay | Admitting: Physician Assistant

## 2020-10-16 ENCOUNTER — Other Ambulatory Visit: Payer: Self-pay

## 2020-10-16 DIAGNOSIS — R1084 Generalized abdominal pain: Secondary | ICD-10-CM | POA: Insufficient documentation

## 2020-11-27 ENCOUNTER — Other Ambulatory Visit: Payer: Self-pay

## 2020-11-27 ENCOUNTER — Emergency Department (HOSPITAL_COMMUNITY): Payer: BC Managed Care – PPO

## 2020-11-27 ENCOUNTER — Encounter (HOSPITAL_COMMUNITY): Payer: Self-pay | Admitting: Emergency Medicine

## 2020-11-27 ENCOUNTER — Encounter (HOSPITAL_COMMUNITY)
Admission: EM | Disposition: A | Discharge status: Home / Self Care | Payer: Self-pay | Source: Home / Self Care | Attending: Internal Medicine

## 2020-11-27 ENCOUNTER — Inpatient Hospital Stay (HOSPITAL_COMMUNITY)
Admission: EM | Admit: 2020-11-27 | Discharge: 2020-11-29 | DRG: 854 | Disposition: A | Discharge status: Home / Self Care | Payer: BC Managed Care – PPO | Attending: Internal Medicine | Admitting: Internal Medicine

## 2020-11-27 ENCOUNTER — Emergency Department (HOSPITAL_COMMUNITY): Payer: BC Managed Care – PPO | Admitting: Anesthesiology

## 2020-11-27 ENCOUNTER — Inpatient Hospital Stay (HOSPITAL_COMMUNITY): Payer: BC Managed Care – PPO

## 2020-11-27 DIAGNOSIS — A4159 Other Gram-negative sepsis: Secondary | ICD-10-CM | POA: Diagnosis present

## 2020-11-27 DIAGNOSIS — F1010 Alcohol abuse, uncomplicated: Secondary | ICD-10-CM | POA: Diagnosis present

## 2020-11-27 DIAGNOSIS — Z789 Other specified health status: Secondary | ICD-10-CM

## 2020-11-27 DIAGNOSIS — N12 Tubulo-interstitial nephritis, not specified as acute or chronic: Secondary | ICD-10-CM

## 2020-11-27 DIAGNOSIS — Z20822 Contact with and (suspected) exposure to covid-19: Secondary | ICD-10-CM | POA: Diagnosis present

## 2020-11-27 DIAGNOSIS — A419 Sepsis, unspecified organism: Secondary | ICD-10-CM | POA: Diagnosis not present

## 2020-11-27 DIAGNOSIS — Z87891 Personal history of nicotine dependence: Secondary | ICD-10-CM

## 2020-11-27 DIAGNOSIS — Z79899 Other long term (current) drug therapy: Secondary | ICD-10-CM | POA: Diagnosis not present

## 2020-11-27 DIAGNOSIS — Z7289 Other problems related to lifestyle: Secondary | ICD-10-CM

## 2020-11-27 DIAGNOSIS — F1090 Alcohol use, unspecified, uncomplicated: Secondary | ICD-10-CM

## 2020-11-27 DIAGNOSIS — K7010 Alcoholic hepatitis without ascites: Secondary | ICD-10-CM | POA: Diagnosis present

## 2020-11-27 DIAGNOSIS — N136 Pyonephrosis: Secondary | ICD-10-CM | POA: Diagnosis present

## 2020-11-27 DIAGNOSIS — E876 Hypokalemia: Secondary | ICD-10-CM | POA: Diagnosis present

## 2020-11-27 DIAGNOSIS — N200 Calculus of kidney: Secondary | ICD-10-CM

## 2020-11-27 DIAGNOSIS — R7989 Other specified abnormal findings of blood chemistry: Secondary | ICD-10-CM | POA: Diagnosis present

## 2020-11-27 DIAGNOSIS — K760 Fatty (change of) liver, not elsewhere classified: Secondary | ICD-10-CM | POA: Diagnosis present

## 2020-11-27 DIAGNOSIS — N201 Calculus of ureter: Secondary | ICD-10-CM | POA: Diagnosis present

## 2020-11-27 DIAGNOSIS — K709 Alcoholic liver disease, unspecified: Secondary | ICD-10-CM | POA: Diagnosis present

## 2020-11-27 DIAGNOSIS — R109 Unspecified abdominal pain: Secondary | ICD-10-CM

## 2020-11-27 DIAGNOSIS — R509 Fever, unspecified: Secondary | ICD-10-CM

## 2020-11-27 HISTORY — DX: Personal history of urinary calculi: Z87.442

## 2020-11-27 HISTORY — PX: CYSTOSCOPY W/ URETERAL STENT PLACEMENT: SHX1429

## 2020-11-27 LAB — PROTIME-INR
INR: 1.1 (ref 0.8–1.2)
Prothrombin Time: 13.9 seconds (ref 11.4–15.2)

## 2020-11-27 LAB — URINALYSIS, ROUTINE W REFLEX MICROSCOPIC
Bilirubin Urine: NEGATIVE
Glucose, UA: NEGATIVE mg/dL
Ketones, ur: 20 mg/dL — AB
Nitrite: NEGATIVE
Protein, ur: 30 mg/dL — AB
Specific Gravity, Urine: 1.014 (ref 1.005–1.030)
WBC, UA: >50 WBC/hpf — ABNORMAL HIGH (ref 0–5)
pH: 6 (ref 5.0–8.0)

## 2020-11-27 LAB — COMPREHENSIVE METABOLIC PANEL
ALT: 193 U/L — ABNORMAL HIGH (ref 0–44)
AST: 669 U/L — ABNORMAL HIGH (ref 15–41)
Albumin: 3.5 g/dL (ref 3.5–5.0)
Alkaline Phosphatase: 218 U/L — ABNORMAL HIGH (ref 38–126)
Anion gap: 13 (ref 5–15)
BUN: <5 mg/dL — ABNORMAL LOW (ref 6–20)
CO2: 25 mmol/L (ref 22–32)
Calcium: 8.5 mg/dL — ABNORMAL LOW (ref 8.9–10.3)
Chloride: 98 mmol/L (ref 98–111)
Creatinine, Ser: 0.86 mg/dL (ref 0.44–1.00)
GFR, Estimated: >60 mL/min (ref >60)
Glucose, Bld: 155 mg/dL — ABNORMAL HIGH (ref 70–99)
Potassium: 2.9 mmol/L — ABNORMAL LOW (ref 3.5–5.1)
Sodium: 136 mmol/L (ref 135–145)
Total Bilirubin: 3.2 mg/dL — ABNORMAL HIGH (ref 0.3–1.2)
Total Protein: 6.4 g/dL — ABNORMAL LOW (ref 6.5–8.1)

## 2020-11-27 LAB — RESP PANEL BY RT-PCR (FLU A&B, COVID) ARPGX2
Influenza A by PCR: NEGATIVE
Influenza B by PCR: NEGATIVE
SARS Coronavirus 2 by RT PCR: NEGATIVE

## 2020-11-27 LAB — CBC
HCT: 40.7 % (ref 36.0–46.0)
Hemoglobin: 14.4 g/dL (ref 12.0–15.0)
MCH: 36.8 pg — ABNORMAL HIGH (ref 26.0–34.0)
MCHC: 35.4 g/dL (ref 30.0–36.0)
MCV: 104.1 fL — ABNORMAL HIGH (ref 80.0–100.0)
Platelets: 143 10*3/uL — ABNORMAL LOW (ref 150–400)
RBC: 3.91 MIL/uL (ref 3.87–5.11)
RDW: 13.4 % (ref 11.5–15.5)
WBC: 7.5 10*3/uL (ref 4.0–10.5)
nRBC: 0 % (ref 0.0–0.2)

## 2020-11-27 LAB — I-STAT BETA HCG BLOOD, ED (MC, WL, AP ONLY): I-stat hCG, quantitative: <5 m[IU]/mL (ref <5)

## 2020-11-27 LAB — LIPASE, BLOOD: Lipase: 28 U/L (ref 11–51)

## 2020-11-27 LAB — LACTIC ACID, PLASMA
Lactic Acid, Venous: 1.3 mmol/L (ref 0.5–1.9)
Lactic Acid, Venous: 0.8 mmol/L (ref 0.5–1.9)

## 2020-11-27 LAB — MAGNESIUM: Magnesium: 1.1 mg/dL — ABNORMAL LOW (ref 1.7–2.4)

## 2020-11-27 SURGERY — CYSTOSCOPY, WITH RETROGRADE PYELOGRAM AND URETERAL STENT INSERTION
Anesthesia: General | Site: Bladder | Laterality: Left

## 2020-11-27 MED ORDER — ONDANSETRON HCL 4 MG/2ML IJ SOLN
INTRAMUSCULAR | Status: AC
  Filled 2020-11-27: qty 2

## 2020-11-27 MED ORDER — SODIUM CHLORIDE 0.9 % IV BOLUS
1000.0000 mL | Freq: Once | INTRAVENOUS | Status: AC
  Administered 2020-11-27: 1000 mL via INTRAVENOUS

## 2020-11-27 MED ORDER — PROPOFOL 10 MG/ML IV BOLUS
INTRAVENOUS | Status: DC | PRN
  Administered 2020-11-27: 200 mg via INTRAVENOUS

## 2020-11-27 MED ORDER — IOHEXOL 300 MG/ML  SOLN
INTRAMUSCULAR | Status: DC | PRN
  Administered 2020-11-27: 6 mL

## 2020-11-27 MED ORDER — LORAZEPAM 1 MG PO TABS: 1.0000 mg | ORAL_TABLET | ORAL | Status: DC | PRN

## 2020-11-27 MED ORDER — POTASSIUM CHLORIDE 10 MEQ/100ML IV SOLN
10.0000 meq | INTRAVENOUS | Status: AC
  Administered 2020-11-27: 10 meq via INTRAVENOUS
  Filled 2020-11-27 (×2): qty 100

## 2020-11-27 MED ORDER — THIAMINE HCL 100 MG/ML IJ SOLN
100.0000 mg | Freq: Every day | INTRAMUSCULAR | Status: DC
  Administered 2020-11-27: 100 mg via INTRAVENOUS
  Filled 2020-11-27: qty 2

## 2020-11-27 MED ORDER — DEXAMETHASONE SODIUM PHOSPHATE 4 MG/ML IJ SOLN
INTRAMUSCULAR | Status: DC | PRN
  Administered 2020-11-27: 10 mg via INTRAVENOUS

## 2020-11-27 MED ORDER — LIDOCAINE 2% (20 MG/ML) 5 ML SYRINGE
INTRAMUSCULAR | Status: DC | PRN
  Administered 2020-11-27: 100 mg via INTRAVENOUS

## 2020-11-27 MED ORDER — IBUPROFEN 200 MG PO TABS: 600.0000 mg | ORAL_TABLET | Freq: Four times a day (QID) | ORAL | Status: DC | PRN

## 2020-11-27 MED ORDER — ONDANSETRON HCL 4 MG/2ML IJ SOLN: 4.0000 mg | Freq: Once | INTRAMUSCULAR | Status: DC | PRN

## 2020-11-27 MED ORDER — FENTANYL CITRATE (PF) 100 MCG/2ML IJ SOLN
INTRAMUSCULAR | Status: DC | PRN
  Administered 2020-11-27 (×4): 50 ug via INTRAVENOUS

## 2020-11-27 MED ORDER — STERILE WATER FOR IRRIGATION IR SOLN
Status: DC | PRN
  Administered 2020-11-27: 3000 mL

## 2020-11-27 MED ORDER — ACETAMINOPHEN 500 MG PO TABS
500.0000 mg | ORAL_TABLET | Freq: Once | ORAL | Status: DC
  Filled 2020-11-27: qty 1

## 2020-11-27 MED ORDER — PIPERACILLIN-TAZOBACTAM 3.375 G IVPB
INTRAVENOUS | Status: AC
  Administered 2020-11-28: 3.375 g via INTRAVENOUS
  Filled 2020-11-27: qty 50

## 2020-11-27 MED ORDER — MAGNESIUM SULFATE 2 GM/50ML IV SOLN
2.0000 g | Freq: Once | INTRAVENOUS | Status: AC
  Administered 2020-11-28: 2 g via INTRAVENOUS
  Filled 2020-11-27: qty 50

## 2020-11-27 MED ORDER — FENTANYL CITRATE (PF) 100 MCG/2ML IJ SOLN: 25.0000 ug | INTRAMUSCULAR | Status: DC | PRN

## 2020-11-27 MED ORDER — LORAZEPAM 2 MG/ML IJ SOLN: 1.0000 mg | INTRAMUSCULAR | Status: DC | PRN

## 2020-11-27 MED ORDER — POTASSIUM CHLORIDE 10 MEQ/100ML IV SOLN
10.0000 meq | INTRAVENOUS | Status: AC
  Administered 2020-11-27 – 2020-11-28 (×3): 10 meq via INTRAVENOUS
  Filled 2020-11-27 (×2): qty 100

## 2020-11-27 MED ORDER — FOLIC ACID 1 MG PO TABS
1.0000 mg | ORAL_TABLET | Freq: Every day | ORAL | Status: DC
  Administered 2020-11-28 – 2020-11-29 (×2): 1 mg via ORAL
  Filled 2020-11-27 (×3): qty 1

## 2020-11-27 MED ORDER — MIDAZOLAM HCL 2 MG/2ML IJ SOLN
INTRAMUSCULAR | Status: AC
  Filled 2020-11-27: qty 2

## 2020-11-27 MED ORDER — CHLORDIAZEPOXIDE HCL 25 MG PO CAPS: 25.0000 mg | ORAL_CAPSULE | ORAL | Status: DC

## 2020-11-27 MED ORDER — CHLORDIAZEPOXIDE HCL 25 MG PO CAPS
25.0000 mg | ORAL_CAPSULE | Freq: Four times a day (QID) | ORAL | Status: AC
  Administered 2020-11-27 – 2020-11-29 (×6): 25 mg via ORAL
  Filled 2020-11-27 (×7): qty 1

## 2020-11-27 MED ORDER — PIPERACILLIN-TAZOBACTAM 3.375 G IVPB
3.3750 g | Freq: Three times a day (TID) | INTRAVENOUS | Status: DC
  Administered 2020-11-27 – 2020-11-29 (×5): 3.375 g via INTRAVENOUS
  Filled 2020-11-27 (×5): qty 50

## 2020-11-27 MED ORDER — FENTANYL CITRATE (PF) 100 MCG/2ML IJ SOLN
INTRAMUSCULAR | Status: AC
  Filled 2020-11-27: qty 2

## 2020-11-27 MED ORDER — POTASSIUM CHLORIDE 20 MEQ PO PACK
40.0000 meq | PACK | Freq: Once | ORAL | Status: AC
  Administered 2020-11-27: 40 meq via ORAL
  Filled 2020-11-27: qty 2

## 2020-11-27 MED ORDER — THIAMINE HCL 100 MG/ML IJ SOLN: 100.0000 mg | Freq: Every day | INTRAMUSCULAR | Status: DC

## 2020-11-27 MED ORDER — CHLORDIAZEPOXIDE HCL 25 MG PO CAPS: 25.0000 mg | ORAL_CAPSULE | Freq: Three times a day (TID) | ORAL | Status: DC

## 2020-11-27 MED ORDER — DEXAMETHASONE SODIUM PHOSPHATE 10 MG/ML IJ SOLN
INTRAMUSCULAR | Status: AC
  Filled 2020-11-27: qty 1

## 2020-11-27 MED ORDER — KETOROLAC TROMETHAMINE 30 MG/ML IJ SOLN: 30.0000 mg | Freq: Once | INTRAMUSCULAR | Status: DC | PRN

## 2020-11-27 MED ORDER — LORAZEPAM 2 MG/ML IJ SOLN
1.0000 mg | Freq: Once | INTRAMUSCULAR | Status: AC
  Administered 2020-11-27: 1 mg via INTRAVENOUS
  Filled 2020-11-27: qty 1

## 2020-11-27 MED ORDER — PIPERACILLIN-TAZOBACTAM 3.375 G IVPB 30 MIN
3.3750 g | INTRAVENOUS | Status: AC
  Administered 2020-11-27: 3.375 g via INTRAVENOUS
  Filled 2020-11-27: qty 50

## 2020-11-27 MED ORDER — ONDANSETRON HCL 4 MG PO TABS: 4.0000 mg | ORAL_TABLET | Freq: Four times a day (QID) | ORAL | Status: DC | PRN

## 2020-11-27 MED ORDER — ONDANSETRON HCL 4 MG/2ML IJ SOLN
INTRAMUSCULAR | Status: DC | PRN
  Administered 2020-11-27: 4 mg via INTRAVENOUS

## 2020-11-27 MED ORDER — LOPERAMIDE HCL 2 MG PO CAPS: 2.0000 mg | ORAL_CAPSULE | ORAL | Status: DC | PRN

## 2020-11-27 MED ORDER — LORAZEPAM 1 MG PO TABS
1.0000 mg | ORAL_TABLET | ORAL | Status: DC | PRN
  Administered 2020-11-28 (×3): 1 mg via ORAL
  Filled 2020-11-27: qty 1
  Filled 2020-11-27: qty 2
  Filled 2020-11-27 (×2): qty 1

## 2020-11-27 MED ORDER — CHLORDIAZEPOXIDE HCL 25 MG PO CAPS: 25.0000 mg | ORAL_CAPSULE | Freq: Every day | ORAL | Status: DC

## 2020-11-27 MED ORDER — LIDOCAINE HCL (PF) 2 % IJ SOLN
INTRAMUSCULAR | Status: AC
  Filled 2020-11-27: qty 5

## 2020-11-27 MED ORDER — LACTATED RINGERS IV SOLN
INTRAVENOUS | Status: DC | PRN
  Administered 2020-11-27: 1000 mL via INTRAVENOUS

## 2020-11-27 MED ORDER — MIDAZOLAM HCL 2 MG/2ML IJ SOLN
INTRAMUSCULAR | Status: DC | PRN
  Administered 2020-11-27: 2 mg via INTRAVENOUS

## 2020-11-27 MED ORDER — IBUPROFEN 200 MG PO TABS
400.0000 mg | ORAL_TABLET | Freq: Four times a day (QID) | ORAL | Status: DC | PRN
  Administered 2020-11-28 (×3): 400 mg via ORAL
  Filled 2020-11-27 (×3): qty 2

## 2020-11-27 MED ORDER — ONDANSETRON HCL 4 MG/2ML IJ SOLN
4.0000 mg | Freq: Once | INTRAMUSCULAR | Status: AC
  Administered 2020-11-27: 4 mg via INTRAVENOUS
  Filled 2020-11-27: qty 2

## 2020-11-27 MED ORDER — HYDROXYZINE HCL 25 MG PO TABS: 25.0000 mg | ORAL_TABLET | Freq: Four times a day (QID) | ORAL | Status: DC | PRN

## 2020-11-27 MED ORDER — ACETAMINOPHEN 500 MG PO TABS
500.0000 mg | ORAL_TABLET | Freq: Once | ORAL | Status: AC
  Administered 2020-11-27: 500 mg via ORAL
  Filled 2020-11-27: qty 1

## 2020-11-27 MED ORDER — ADULT MULTIVITAMIN W/MINERALS CH
1.0000 | ORAL_TABLET | Freq: Every day | ORAL | Status: DC
  Administered 2020-11-29: 1 via ORAL
  Filled 2020-11-27 (×3): qty 1

## 2020-11-27 MED ORDER — KETOROLAC TROMETHAMINE 30 MG/ML IJ SOLN
INTRAMUSCULAR | Status: AC
  Filled 2020-11-27: qty 1

## 2020-11-27 MED ORDER — ONDANSETRON HCL 4 MG/2ML IJ SOLN: 4.0000 mg | Freq: Four times a day (QID) | INTRAMUSCULAR | Status: DC | PRN

## 2020-11-27 MED ORDER — THIAMINE HCL 100 MG PO TABS
100.0000 mg | ORAL_TABLET | Freq: Every day | ORAL | Status: DC
  Administered 2020-11-28 – 2020-11-29 (×2): 100 mg via ORAL
  Filled 2020-11-27 (×3): qty 1

## 2020-11-27 MED ORDER — PANTOPRAZOLE SODIUM 40 MG PO TBEC
40.0000 mg | DELAYED_RELEASE_TABLET | Freq: Two times a day (BID) | ORAL | Status: DC
  Administered 2020-11-27 – 2020-11-29 (×4): 40 mg via ORAL
  Filled 2020-11-27 (×5): qty 1

## 2020-11-27 SURGICAL SUPPLY — 18 items
BAG URINE DRAIN 2000ML AR STRL (UROLOGICAL SUPPLIES) ×3 IMPLANT
BAG URO CATCHER STRL LF (MISCELLANEOUS) ×3 IMPLANT
CATH FOLEY 2WAY SLVR  5CC 20FR (CATHETERS) ×2
CATH FOLEY 2WAY SLVR 5CC 20FR (CATHETERS) ×1 IMPLANT
CATH INTERMIT  6FR 70CM (CATHETERS) ×3 IMPLANT
CATH URET 5FR 28IN OPEN ENDED (CATHETERS) ×3 IMPLANT
CLOTH BEACON ORANGE TIMEOUT ST (SAFETY) ×3 IMPLANT
GLOVE BIOGEL M 7.0 STRL (GLOVE) ×3 IMPLANT
GOWN STRL REUS W/TWL LRG LVL3 (GOWN DISPOSABLE) ×6 IMPLANT
GUIDEWIRE STR DUAL SENSOR (WIRE) ×3 IMPLANT
GUIDEWIRE ZIPWRE .038 STRAIGHT (WIRE) IMPLANT
KIT TURNOVER KIT A (KITS) IMPLANT
MANIFOLD NEPTUNE II (INSTRUMENTS) ×3 IMPLANT
PACK CYSTO (CUSTOM PROCEDURE TRAY) ×3 IMPLANT
STENT URET 6FRX24 CONTOUR (STENTS) ×3 IMPLANT
TUBING CONNECTING 10 (TUBING) ×2 IMPLANT
TUBING CONNECTING 10' (TUBING) ×1
TUBING UROLOGY SET (TUBING) IMPLANT

## 2020-11-27 NOTE — Anesthesia Preprocedure Evaluation (Signed)
Anesthesia Evaluation  Patient identified by MRN, date of birth, ID band Patient awake    Reviewed: Allergy & Precautions, NPO status , Patient's Chart, lab work & pertinent test results  Airway Mallampati: II  TM Distance: >3 FB Neck ROM: Full    Dental no notable dental hx.    Pulmonary neg pulmonary ROS, former smoker,    Pulmonary exam normal breath sounds clear to auscultation       Cardiovascular negative cardio ROS Normal cardiovascular exam Rhythm:Regular Rate:Normal     Neuro/Psych Anxiety negative neurological ROS     GI/Hepatic negative GI ROS, Neg liver ROS,   Endo/Other  negative endocrine ROS  Renal/GU negative Renal ROS  negative genitourinary   Musculoskeletal negative musculoskeletal ROS (+)   Abdominal   Peds negative pediatric ROS (+)  Hematology negative hematology ROS (+)   Anesthesia Other Findings   Reproductive/Obstetrics negative OB ROS                             Anesthesia Physical Anesthesia Plan  ASA: II  Anesthesia Plan: General   Post-op Pain Management:    Induction: Intravenous  PONV Risk Score and Plan: 3 and Ondansetron, Dexamethasone, Treatment may vary due to age or medical condition and Midazolam  Airway Management Planned: LMA  Additional Equipment:   Intra-op Plan:   Post-operative Plan: Extubation in OR  Informed Consent: I have reviewed the patients History and Physical, chart, labs and discussed the procedure including the risks, benefits and alternatives for the proposed anesthesia with the patient or authorized representative who has indicated his/her understanding and acceptance.     Dental advisory given  Plan Discussed with: CRNA and Surgeon  Anesthesia Plan Comments:         Anesthesia Quick Evaluation

## 2020-11-27 NOTE — Progress Notes (Signed)
1 Day Post-Op Subjective: Pain controlled. No nausea or emesis. Temp curve downtrending, now afebrile.  Objective: Vital signs in last 24 hours: Temp:  [98.3 F (36.8 C)-103 F (39.4 C)] 98.3 F (36.8 C) (12/03 0422) Pulse Rate:  [58-128] 58 (12/03 0422) Resp:  [14-24] 17 (12/03 0422) BP: (106-154)/(63-104) 116/78 (12/03 0422) SpO2:  [95 %-100 %] 98 % (12/03 0422) Weight:  [64.4 kg] 64.4 kg (12/02 0858)  Intake/Output from previous day: 12/02 0701 - 12/03 0700 In: 2602.4 [P.O.:120; IV Piggyback:2482.4] Out: 1850 [Urine:1850] Intake/Output this shift: No intake/output data recorded.  Physical Exam:  General: Alert and oriented CV: RRR Lungs: Clear Abdomen: Soft, ND, NT Ext: NT, No erythema  Lab Results: Recent Labs    11/27/20 0918 11/28/20 0340  HGB 14.4 11.9*  HCT 40.7 34.0*   BMET Recent Labs    11/27/20 0918 11/28/20 0340  NA 136 137  K 2.9* 3.3*  CL 98 106  CO2 25 21*  GLUCOSE 155* 194*  BUN <5* <5*  CREATININE 0.86 0.54  CALCIUM 8.5* 7.8*     Studies/Results: CT ABDOMEN PELVIS WO CONTRAST  Result Date: 11/27/2020 CLINICAL DATA:  Abdominal pain.  Elevated liver enzymes. EXAM: CT ABDOMEN AND PELVIS WITHOUT CONTRAST TECHNIQUE: Multidetector CT imaging of the abdomen and pelvis was performed following the standard protocol without IV contrast. COMPARISON:  None. FINDINGS: Lower chest: Lung bases clear.  No pleural or pericardial effusion. Hepatobiliary: The liver is markedly hypoattenuating throughout and measures approximately 22 cm craniocaudal. No focal liver lesion is identified. The gallbladder and biliary tree are unremarkable. Pancreas: Unremarkable. No pancreatic ductal dilatation or surrounding inflammatory changes. Spleen: Normal in size without focal abnormality. Adrenals/Urinary Tract: The patient has moderate left hydronephrosis due to a 0.3 cm proximal left ureteral stone. No other urinary tract stones are identified. No focal renal lesion.  Urinary bladder appears normal. Adrenal glands appear normal. Stomach/Bowel: Stomach is within normal limits. Appendix appears normal. No evidence of bowel wall thickening, distention, or inflammatory changes. Vascular/Lymphatic: No significant vascular findings are present. No enlarged abdominal or pelvic lymph nodes. Reproductive: Uterus and bilateral adnexa are unremarkable. IUD noted. Other: None. Musculoskeletal: No acute or focal abnormality. Mild convex left curvature is likely positional. IMPRESSION: Moderate left hydronephrosis due to a 0.3 cm proximal left ureteral stone. Hepatomegaly and severe fatty infiltration of the liver. Electronically Signed   By: Drusilla Kanner M.D.   On: 11/27/2020 15:06   DG C-Arm 1-60 Min-No Report  Result Date: 11/27/2020 Fluoroscopy was utilized by the requesting physician.  No radiographic interpretation.   US Abdomen Limited RUQ (LIVER/GB)  Result Date: 11/27/2020 CLINICAL DATA:  Acute right upper quadrant abdominal pain. EXAM: ULTRASOUND ABDOMEN LIMITED RIGHT UPPER QUADRANT COMPARISON:  October 16, 2020. FINDINGS: Gallbladder: No gallstones or wall thickening visualized. No sonographic Murphy sign noted by sonographer. Common bile duct: Diameter: 2 mm which is within normal limits. Liver: No focal lesion identified. Mildly increased echogenicity of hepatic parenchyma is noted suggesting hepatic steatosis. Portal vein is patent on color Doppler imaging with normal direction of blood flow towards the liver. Other: None. IMPRESSION: Probable hepatic steatosis. No other abnormality seen in the right upper quadrant of the abdomen. Electronically Signed   By: Lupita Raider M.D.   On: 11/27/2020 12:59    Assessment/Plan: 1. Sepsis due to obstructing left ureteral stone: CT A/P 11/27/2020 revealed 3 mm proximal left ureteral stone associate with moderate left hydronephrosis.  Febrile 102.6, WBC 7.5, creatinine 0.86, urinalysis with negative  nitrite, large leukocyte  esterase, few bacteria. S/p cysto, L RPG, L stent placement 11/27/2020.  -Follow-up L RP urine culture -Continue broad-spectrum antibiotics -Will need to send home on a course of culture specific PO abx -We will need definitive treatment of stone as outpatient.  Message schedulers to arrange for follow-up -Please call with questions.   LOS: 1 day   Matt R. Gaylen Pereira MD 11/28/2020, 7:14 AM [facility]  Pager: [phone]

## 2020-11-27 NOTE — H&P (Signed)
History and Physical    Tonya Holt WYO:[id number] DOB: 06/13/1991 DOA: 11/27/2020  PCP: Idelle Crouch, MD  Patient coming from: Home via [facility] ED  I have personally briefly reviewed patient's old medical records in [place]  Chief Complaint: Abdominal pain, elevated LFTs  HPI: Tonya Holt is a 29 y.o. female with medical history significant for alcohol use, hepatic steatosis with elevated LFTs, ADHD, anxiety who presented to the ED for evaluation of transaminitis.  Patient states she saw her GI specialist on 11/26/2020 for evaluation of abdominal pain, nausea, and vomiting. Lab work was obtained and she was called with reported significantly elevated LFTs to the 800s. She was advised to present to the ED for further evaluation. Patient tried to manage symptoms at home until today when she was having continued left-sided abdominal pain, nausea, vomiting and then presented to [facility] ED for further evaluation.  Patient says she has been having 3 weeks of dysuria. Over the last few days she has been having intermittent fevers, chills, diaphoresis, poor oral intake. Today her left side abdominal pain began to radiate towards her left flank.  As described below, she was subsequently found to have an obstructive left ureteral stone and underwent cystoscopy with left ureteral stent placement by urology, Dr. Rexene Alberts tonight (11/27/2020). I saw patient on the floor after her urological procedure.  Patient states she is feeling much better and is now tolerating oral intake for the first time in several weeks. She is only having mild left-sided discomfort now.  She does have a history of significant alcohol use. She says she was drinking up to 6 drinks daily when in September she tried to quit abruptly. She developed what sounds like alcohol withdrawal seizures. She then restarted alcohol use with plan to slowly cut back and eventually completely quit. She says over the  last 1-2 weeks she has been having about 4 drinks of beer/wine daily. Her last drink was 2 days prior to admission. She says she is working with her primary care physician and GI specialist on outpatient detoxification.  ED Course:  Patient initially presented to [facility] ED per recommendation of her GI specialist due to concern for alcohol withdrawal.  Initial vitals showed BP 154/104, pulse 128, RR 22, temp 99.9 F, SPO2 95% on room air.  T-max was 102.6 F while in the ED.  Labs showed WBC 7.5, hemoglobin 14.4, platelets 143,000, sodium 136, potassium 2.9, bicarb 25, BUN <5, creatinine 0.86, AST 669, ALT 193, alk phos 218, total bilirubin 3.2, lipase 28.  Lactic acid 1.3 and 0.8.  I-STAT beta-hCG <5.0.  Urinalysis showed 20 ketones, 30 protein, negative nitrites, large leukocytes, 6-10 RBC/hpf, >50 WBC/hpf, few bacteria on microscopy.  Blood cultures were obtained and pending.  SARS-CoV-2 PCR is negative.  Influenza A/B PCR's are negative.  RUQ ultrasound showed changes suggestive of hepatic steatosis without other abnormality.  CT abdomen/pelvis without contrast was obtained which showed moderate left hydronephrosis due to a 0.3 cm proximal left ureteral stone.  Hepatomegaly and severe fatty infiltration of the liver also noted.  EDP discussed with on-call urology who recommended transfer to [facility] long ED for formal consultation.  Patient was given 3 L normal saline, IV Zofran, IV Ativan 1 mg x 2, IV Zosyn, Tylenol, IV K 10 mEq x 3 doses.  On-call urology, Dr. Rexene Alberts consulted on patient and patient was taken to the OR for cystoscopy with left ureteral stent placement.  Urine was  obtained and sent for culture.  The hospitalist service was consulted to admit for further evaluation and management due to sepsis physiology.  Review of Systems: All systems reviewed and are negative except as documented in history of present illness above.   Past Medical History:  Diagnosis Date    ADHD (attention deficit hyperactivity disorder) 2001   Anxiety    Dysmenorrhea treated with oral contraceptive 08/14/2014   History of kidney stones    Hormone disorder     Past Surgical History:  Procedure Laterality Date   LAPAROSCOPIC OVARIAN CYSTECTOMY Left 7517   follicular cyst, no endometriosis   MENISCUS REPAIR  12/2012   TONSILLECTOMY  Age 32    Social History:  reports that she quit smoking about 3 years ago. She has never used smokeless tobacco. She reports previous alcohol use of about 2.0 standard drinks of alcohol per week. She reports that she does not use drugs.  Allergies  Allergen Reactions   Clonazepam Other (See Comments)    Makes her forget what she did the day before.   Escitalopram Other (See Comments)    Made her moody and feel crazy   Iodinated Diagnostic Agents Other (See Comments)    "makes her crazy" "makes her crazy"   Latex     Family History  Problem Relation Age of Onset   Ovarian cancer Other        Maternal Great Grandmother   Breast cancer Paternal Grandmother 66   Diabetes Father    Hyperthyroidism Father    Diabetes Paternal Grandfather    Diabetes Paternal Uncle    Endometriosis Mother    Hypertension Mother      Prior to Admission medications   Medication Sig Start Date End Date Taking? Authorizing Provider  levonorgestrel (MIRENA) 20 MCG/24HR IUD 1 each by Intrauterine route once.   Yes [provider]  ondansetron (ZOFRAN-ODT) 4 MG disintegrating tablet Take by mouth. 11/26/20  Yes [provider]  pantoprazole (PROTONIX) 40 MG tablet Take 40 mg by mouth 2 (two) times daily. 11/26/20  Yes [provider]  propranolol (INDERAL) 20 MG tablet Take 20 mg by mouth 2 (two) times daily. 11/08/20  Yes [provider]  benzonatate (TESSALON) 200 MG capsule Take one cap TID PRN cough Patient not taking: Reported on 11/27/2020 03/23/18   Lorin Picket, PA-C    chlorpheniramine-HYDROcodone [facility] PENNKINETIC ER) 10-8 MG/5ML SUER Take 5 mLs by mouth 2 (two) times daily. Patient not taking: Reported on 11/27/2020 03/23/18   Crecencio Mc P, PA-C  fluticasone [facility]) 50 MCG/ACT nasal spray Place 2 sprays into both nostrils daily. Patient not taking: Reported on 11/27/2020 03/23/18   Lorin Picket, PA-C    Physical Exam: Vitals:   11/27/20 2007 11/27/20 2015 11/27/20 2027 11/27/20 2042  BP: 117/75 121/63 110/70 121/73  Pulse: 100 96 95 98  Resp: _0 Temp: 98.6 F (37 C)   99.7 F (37.6 C)  TempSrc:    Oral  SpO2: 98% 99% 100% 100%  Weight:      Height:       Constitutional: Sitting up in bed, NAD, calm, comfortable Eyes: PERRL, lids and conjunctivae normal ENMT: Mucous membranes are moist. Posterior pharynx clear of any exudate or lesions.Normal dentition.  Neck: normal, supple, no masses. Respiratory: clear to auscultation bilaterally, no wheezing, no crackles. Normal respiratory effort. No accessory muscle use.  Cardiovascular: Regular rate and rhythm, no murmurs / rubs / gallops. No extremity edema.  2+ pedal pulses. Abdomen: Mild RLQ, suprapubic, and left-sided abdominal tenderness, no masses palpated. Bowel sounds positive.  Musculoskeletal: no clubbing / cyanosis. No joint deformity upper and lower extremities. Good ROM, no contractures. Normal muscle tone.  Skin: no rashes, lesions, ulcers. No induration Neurologic: CN 2-12 grossly intact. Sensation intact, Strength 5/5 in all 4.  Psychiatric: Normal judgment and insight. Alert and oriented x 3. Normal mood.   Labs on Admission: I have personally reviewed following labs and imaging studies  CBC: Recent Labs  Lab 11/27/20 0918  WBC 7.5  HGB 14.4  HCT 40.7  MCV 104.1*  PLT 165*   Basic Metabolic Panel: Recent Labs  Lab 11/27/20 0918  NA 136  K 2.9*  CL 98  CO2 25  GLUCOSE 155*  BUN <5*  CREATININE 0.86  CALCIUM 8.5*   GFR: Estimated Creatinine  Clearance: 87.9 mL/min (by C-G formula based on SCr of 0.86 mg/dL). Liver Function Tests: Recent Labs  Lab 11/27/20 0918  AST 669*  ALT 193*  ALKPHOS 218*  BILITOT 3.2*  PROT 6.4*  ALBUMIN 3.5   Recent Labs  Lab 11/27/20 0918  LIPASE 28   No results for input(s): AMMONIA in the last 168 hours. Coagulation Profile: Recent Labs  Lab 11/27/20 1127  INR 1.1   Cardiac Enzymes: No results for input(s): CKTOTAL, CKMB, CKMBINDEX, TROPONINI in the last 168 hours. BNP (last 3 results) No results for input(s): PROBNP in the last 8760 hours. HbA1C: No results for input(s): HGBA1C in the last 72 hours. CBG: No results for input(s): GLUCAP in the last 168 hours. Lipid Profile: No results for input(s): CHOL, HDL, LDLCALC, TRIG, CHOLHDL, LDLDIRECT in the last 72 hours. Thyroid Function Tests: No results for input(s): TSH, T4TOTAL, FREET4, T3FREE, THYROIDAB in the last 72 hours. Anemia Panel: No results for input(s): VITAMINB12, FOLATE, FERRITIN, TIBC, IRON, RETICCTPCT in the last 72 hours. Urine analysis:    Component Value Date/Time   COLORURINE AMBER (A) 11/27/2020 0919   APPEARANCEUR HAZY (A) 11/27/2020 0919   LABSPEC 1.014 11/27/2020 0919   PHURINE 6.0 11/27/2020 0919   GLUCOSEU NEGATIVE 11/27/2020 0919   HGBUR SMALL (A) 11/27/2020 0919   BILIRUBINUR NEGATIVE 11/27/2020 0919   BILIRUBINUR - 08/14/2014 0921   KETONESUR 20 (A) 11/27/2020 0919   PROTEINUR 30 (A) 11/27/2020 0919   UROBILINOGEN negative 08/14/2014 0921   NITRITE NEGATIVE 11/27/2020 0919   LEUKOCYTESUR LARGE (A) 11/27/2020 0919    Radiological Exams on Admission: CT ABDOMEN PELVIS WO CONTRAST  Result Date: 11/27/2020 CLINICAL DATA:  Abdominal pain.  Elevated liver enzymes. EXAM: CT ABDOMEN AND PELVIS WITHOUT CONTRAST TECHNIQUE: Multidetector CT imaging of the abdomen and pelvis was performed following the standard protocol without IV contrast. COMPARISON:  None. FINDINGS: Lower chest: Lung bases clear.  No  pleural or pericardial effusion. Hepatobiliary: The liver is markedly hypoattenuating throughout and measures approximately 22 cm craniocaudal. No focal liver lesion is identified. The gallbladder and biliary tree are unremarkable. Pancreas: Unremarkable. No pancreatic ductal dilatation or surrounding inflammatory changes. Spleen: Normal in size without focal abnormality. Adrenals/Urinary Tract: The patient has moderate left hydronephrosis due to a 0.3 cm proximal left ureteral stone. No other urinary tract stones are identified. No focal renal lesion. Urinary bladder appears normal. Adrenal glands appear normal. Stomach/Bowel: Stomach is within normal limits. Appendix appears normal. No evidence of bowel wall thickening, distention, or inflammatory changes. Vascular/Lymphatic: No significant vascular findings are present. No enlarged abdominal or pelvic lymph nodes. Reproductive: Uterus and bilateral adnexa  are unremarkable. IUD noted. Other: None. Musculoskeletal: No acute or focal abnormality. Mild convex left curvature is likely positional. IMPRESSION: Moderate left hydronephrosis due to a 0.3 cm proximal left ureteral stone. Hepatomegaly and severe fatty infiltration of the liver. Electronically Signed   By: Inge Rise M.D.   On: 11/27/2020 15:06   DG C-Arm 1-60 Min-No Report  Result Date: 11/27/2020 Fluoroscopy was utilized by the requesting physician.  No radiographic interpretation.   US Abdomen Limited RUQ (LIVER/GB)  Result Date: 11/27/2020 CLINICAL DATA:  Acute right upper quadrant abdominal pain. EXAM: ULTRASOUND ABDOMEN LIMITED RIGHT UPPER QUADRANT COMPARISON:  October 16, 2020. FINDINGS: Gallbladder: No gallstones or wall thickening visualized. No sonographic Murphy sign noted by sonographer. Common bile duct: Diameter: 2 mm which is within normal limits. Liver: No focal lesion identified. Mildly increased echogenicity of hepatic parenchyma is noted suggesting hepatic steatosis. Portal  vein is patent on color Doppler imaging with normal direction of blood flow towards the liver. Other: None. IMPRESSION: Probable hepatic steatosis. No other abnormality seen in the right upper quadrant of the abdomen. Electronically Signed   By: Marijo Conception M.D.   On: 11/27/2020 12:59    EKG: Not performed.  Assessment/Plan Principal Problem:   Sepsis ([place]) Active Problems:   Elevated LFTs   Alcohol use   Left ureteral calculus  Tonya Holt is a 29 y.o. female with medical history significant for alcohol use, hepatic steatosis with elevated LFTs, ADHD, anxiety who is admitted with sepsis due to obstructing left ureteral stone.  Sepsis due to obstructing left ureteral stone: S/p cystoscopy with left ureteral stent placement by urology, Dr. Rexene Alberts on 11/27/2020.  Sepsis physiology improved. -Continue empiric IV Zosyn -Follow blood cultures and intraoperative urine culture and narrow antibiotics accordingly -Urology following  Elevated LFTs/hepatic steatosis: Likely secondary to chronic alcohol use.  Following with GI, Dr. Alice Reichert with plans for EGD on 12/08/2020.  RUQ ultrasound and CT abdomen/pelvis showed hepatic steatosis/fatty infiltration of the liver without acute abnormality. -Continue monitor LFTs  Alcohol use disorder: Patient with chronic long-term alcohol use with what sounds like alcohol withdrawal seizures in September after attempting to quit abruptly.  Last drink was 2 days prior to admission.  She is motivated to completely quit. -Place on CIWA protocol with Librium taper and as needed Ativan  Hypokalemia: Replete with IV and oral potassium.  Check magnesium and replete if needed.  DVT prophylaxis: SCDs Code Status: Full code, confirmed with patient Family Communication: Discussed with patient, she has discussed with family Disposition Plan: From home and likely discharge to home pending continued symptomatic improvement, ability to maintain adequate oral  intake and transition to oral antibiotics Consults called: Neurology Admission status:  Status is: Inpatient  Remains inpatient appropriate because:IV treatments appropriate due to intensity of illness or inability to take PO and Inpatient level of care appropriate due to severity of illness   Dispo: The patient is from: Home              Anticipated d/c is to: Home              Anticipated d/c date is: 2 days              Patient currently is not medically stable to d/c.   Zada Finders MD [facility]  If 7PM-7AM, please contact night-coverage [email or web address]  11/27/2020, 9:04 PM

## 2020-11-27 NOTE — Progress Notes (Signed)
Pharmacy Antibiotic Note  Tonya Holt is a 29 y.o. female admitted on 11/27/2020 with elevated liver enzymes.  Pharmacy has been consulted for Zosyn dosing for sepsis.  Renal function stable, Tmax 101.9, WBC WNL, LA 1.3.  Plan: Zosyn EID 3.375gm IV Q8H Pharmacy will sign off with stable renal fxn.  Thank you for the consult!  Height: 5\' 3"  (160 cm) Weight: 64.4 kg (142 lb) IBW/kg (Calculated) : 52.4  Temp (24hrs), Avg:100.6 F (38.1 C), Min:99.9 F (37.7 C), Max:101.9 F (38.8 C)  Recent Labs  Lab 11/27/20 0918 11/27/20 1127  WBC 7.5  --   CREATININE 0.86  --   LATICACIDVEN  --  1.3    Estimated Creatinine Clearance: 87.9 mL/min (by C-G formula based on SCr of 0.86 mg/dL).    Allergies  Allergen Reactions  . Iodinated Diagnostic Agents Other (See Comments)    "makes her crazy" "makes her crazy"  . Latex     Tonya Holt D. 14/02/21, PharmD, BCPS, BCCCP 11/27/2020, 12:26 PM

## 2020-11-27 NOTE — Transfer of Care (Signed)
Immediate Anesthesia Transfer of Care Note  Patient: Tonya Holt  Procedure(s) Performed: CYSTOSCOPY WITH RETROGRADE PYELOGRAM/URETERAL STENT PLACEMENT (Left Bladder)  Patient Location: PACU  Anesthesia Type:General  Level of Consciousness: awake, alert , oriented and patient cooperative  Airway & Oxygen Therapy: Patient Spontanous Breathing  Post-op Assessment: Report given to RN and Post -op Vital signs reviewed and stable  Post vital signs: Reviewed and stable  Last Vitals:  Vitals Value Taken Time  BP 117/75 11/27/20 2006  Temp    Pulse 102 11/27/20 2007  Resp 14 11/27/20 2007  SpO2 97 % 11/27/20 2007  Vitals shown include unvalidated device data.  Last Pain:  Vitals:   11/27/20 1916  TempSrc: Oral  PainSc: 3          Complications: No complications documented.

## 2020-11-27 NOTE — ED Triage Notes (Signed)
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

## 2020-11-27 NOTE — Op Note (Signed)
Operative Note  Preoperative diagnosis:  1.  Left ureteral stone  Postoperative diagnosis: 1.  Left ureteral stone  Procedure(s): 1.  Cystoscopy 2. Left retrograde pyelogram with interpretation 3. Left ureteral stent placement  Surgeon: Jettie Pagan, MD  Assistants:  None  Anesthesia:  General  Complications:  None  EBL:  minimal  Specimens: 1.  ID Type Source Tests Collected by Time Destination  A : LEFT RENAL PELVIS URINE Urine PATH Cytology Urine URINE CULTURE Jannifer Hick, MD 11/27/2020 1941     Drains/Catheters: 1.  Left 6 [place] by 24 cm ureteral stent  Intraoperative findings:   1. Cystoscopy demonstrated evidence of cystitis.  Left retrograde pyelogram demonstrated left moderate hydronephrosis proximal to the obstructing left ureteral stone.  There is no extravasation of contrast.  Successful placement of left ureteral stent with curl noted within the left renal pelvis/lower pole and bladder respectively.  Indication:  Tonya Holt is a 29 y.o. female with a proximal left ureteral stone, signs of obstruction and sepsis. After reviewing the management options for treatment, she elected to proceed with the above surgical procedure(s). We have discussed the potential benefits and risks of the procedure, side effects of the proposed treatment, the likelihood of the patient achieving the goals of the procedure, and any potential problems that might occur during the procedure or recuperation. Informed consent has been obtained.  Description of procedure: The patient was taken to the operating room and general anesthesia was induced.  The patient was placed in the dorsal lithotomy position, prepped and draped in the usual sterile fashion, and preoperative antibiotics were administered. A preoperative time-out was performed.   Cystourethroscopy was performed.  The patient's urethra was examined and was normal. The bladder was then systematically examined in its entirety.  There was no evidence for any bladder tumors, stones, or other mucosal pathology.    Attention then turned to the left ureteral orifice and a ureteral catheter was used to intubate the ureteral orifice with the aid of a 0.038 sensor wire. This was advanced to the kidney. A 5 Fr open ended catheter was then advanced and a left renal pelvis urine was obtained for urine culture.  Omnipaque contrast was injected through the ureteral catheter and a retrograde pyelogram was performed with findings as dictated above.  There is moderate left-sided hydronephrosis with no extravasation of contrast.  Wire was replaced.  A 0.38 sensor guidewire was then advanced up the left ureter into the renal pelvis under fluoroscopic guidance.  The wire was then backloaded through the cystoscope and a 6 [place] by 24 cm left ureteral stent was advance over the wire using Seldinger technique.  The stent was positioned appropriately under fluoroscopic and cystoscopic guidance.  The wire was then removed with an adequate stent curl noted in the renal pelvis/lower pole as well as in the bladder.  There was some blood emanating from the left ureteral orifice after stent placement as there was some difficulty advancing the stent beyond the stone.  A Foley catheter was placed to maximally drain the patient.  The patient appeared to tolerate the procedure well and without complications.  The patient was able to be awakened and transferred to the recovery unit in satisfactory condition.   Plan: Keep inpatient on broad-spectrum antibiotics.  Follow-up left renal pelvis urine culture.  Will need definitive treatment of left ureteral stone as outpatient.  Matt R. Jef Futch MD [facility]  Pager: [phone]

## 2020-11-27 NOTE — Plan of Care (Signed)
  Problem: Education: Goal: Knowledge of General Education information will improve Description: Including pain rating scale, medication(s)/side effects and non-pharmacologic comfort measures Outcome: Progressing   Problem: Health Behavior/Discharge Planning: Goal: Ability to manage health-related needs will improve Outcome: Progressing   Problem: Clinical Measurements: Goal: Ability to maintain clinical measurements within normal limits will improve Outcome: Progressing Goal: Will remain free from infection Outcome: Progressing Goal: Diagnostic test results will improve 11/27/2020 2105 by Robyne Peers, RN Outcome: Progressing 11/27/2020 2105 by Robyne Peers, RN Outcome: Adequate for Discharge Goal: Respiratory complications will improve Outcome: Progressing Goal: Cardiovascular complication will be avoided Outcome: Progressing   Problem: Activity: Goal: Risk for activity intolerance will decrease Outcome: Progressing   Problem: Nutrition: Goal: Adequate nutrition will be maintained Outcome: Progressing   Problem: Coping: Goal: Level of anxiety will decrease Outcome: Progressing   Problem: Elimination: Goal: Will not experience complications related to bowel motility Outcome: Progressing Goal: Will not experience complications related to urinary retention Outcome: Progressing   Problem: Pain Managment: Goal: General experience of comfort will improve Outcome: Progressing   Problem: Safety: Goal: Ability to remain free from injury will improve Outcome: Progressing   Problem: Skin Integrity: Goal: Risk for impaired skin integrity will decrease Outcome: Progressing

## 2020-11-27 NOTE — ED Provider Notes (Signed)
I assumed care of patient in transfer.  Patient was originally seen by Dr./Men at [facility] for elevated hepatic enzymes and concern for alcohol withdrawal.  Patient was found to have concerning for urinary stone as CT showed a 3 mm obstructing left-sided ureteral stone with mild hydronephrosis and UA looks infected.  Per Dr. Teodoro Spray she spoke with Dr. Benancio Deeds of urology who had recommended transfer to [facility] long for further evaluation.   Physical Exam  BP 129/85 (BP Location: Right Arm)   Pulse 90   Temp (!) 101.4 F (38.6 C) (Oral)   Resp 18   Ht 5\' 3"  (1.6 m)   Wt 64.4 kg   SpO2 99%   BMI 25.15 kg/m   Patient is awake and alert.  She is sitting in bed in no obvious distress.  She denies any current withdrawal-like symptoms.  No active vomiting.  ED Course/Procedures     Procedures   Medications  piperacillin-tazobactam (ZOSYN) IVPB 3.375 g (has no administration in time range)  acetaminophen (TYLENOL) tablet 500 mg (0 mg Oral Hold 11/27/20 1636)  LORazepam (ATIVAN) tablet 1-4 mg (has no administration in time range)    Or  LORazepam (ATIVAN) injection 1-4 mg (has no administration in time range)  thiamine (B-1) injection 100 mg (has no administration in time range)  sodium chloride 0.9 % bolus 1,000 mL (0 mLs Intravenous Stopped 11/27/20 1403)  sodium chloride 0.9 % bolus 1,000 mL (0 mLs Intravenous Stopped 11/27/20 1403)  LORazepam (ATIVAN) injection 1 mg (1 mg Intravenous Given 11/27/20 1209)  ondansetron (ZOFRAN) injection 4 mg (4 mg Intravenous Given 11/27/20 1208)  piperacillin-tazobactam (ZOSYN) IVPB 3.375 g (0 g Intravenous Stopped 11/27/20 1403)  acetaminophen (TYLENOL) tablet 500 mg (500 mg Oral Given 11/27/20 1459)  sodium chloride 0.9 % bolus 1,000 mL (1,000 mLs Intravenous New Bag/Given 11/27/20 1636)  LORazepam (ATIVAN) injection 1 mg (1 mg Intravenous Given 11/27/20 1636)     MDM   1745: I spoke with Dr. 14/2/21 of urology.  He states that at 6 PM Dr. Benancio Deeds will be  taking call.  Dr. Cardell Peach is reportedly aware of this patient, after 6 PM will call Dr. Cardell Peach.  Review of labs from [facility] show hypokalemia, IV potassium is ordered and magnesium level is ordered.  CIWA protocol is ordered.  She has already received Zosyn.  Her Covid test is negative.  Hospitalist will be consulted for admission.  I spoke with [facility]. [facility] who will see patient for admission.  I spoke with Dr. Allena Katz who will take patient to the OR for stent.   Note: Portions of this report may have been transcribed using voice recognition software. Every effort was made to ensure accuracy; however, inadvertent computerized transcription errors may be present    Cardell Peach, PA-C 11/28/20 0000    14/03/21, MD 12/01/20 [phone]

## 2020-11-27 NOTE — ED Notes (Signed)
To Ultrasound

## 2020-11-27 NOTE — ED Notes (Signed)
Pt to US at this time.

## 2020-11-27 NOTE — ED Notes (Signed)
MD notified of patient's elevated temp. Pt is resting and in NAD. Resp even and unlabored.

## 2020-11-27 NOTE — ED Notes (Addendum)
Pt bib [facility] from [facility] following abd / flank pain. Pt has hx of cirrosis and fatty liver disease. Dx with 0.68mm left sided kidney stone at [facility]. Given 500mg  tylenol at [facility] to treat fever of 99.67f.  Vitals with [facility]: 114/75 HR 88 RR 17 99%

## 2020-11-27 NOTE — Consult Note (Signed)
Urology Consult   Physician requesting consult: Alvira Monday, MD  Reason for consult: Sepsis due to obstructing ureteral stone  History of Present Illness: Tonya Holt is a 29 y.o. presented to the ED earlier today with left upper abdominal pain over the past few days that acutely worsened last night.  Is been associated with nausea and emesis over the past several days.  She had an elevated temp in the 100s at home and febrile in the ED to 102.6.  She does have a history of transaminitis and dyspepsia.  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  CT A/P 11/27/2020 revealed 3 mm proximal left ureteral stone associate with moderate left hydronephrosis.  There are no other nephrolithiasis identified.  WBC 7.5, creatinine 0.86, urinalysis with negative nitrite, large leukocyte esterase, few bacteria.  She denies a history of voiding or storage urinary symptoms, hematuria, UTIs, STDs, urolithiasis, GU malignancy/trauma/surgery.  Past Medical History:  Diagnosis Date  . ADHD (attention deficit hyperactivity disorder) 2001  . Anxiety   . Dysmenorrhea treated with oral contraceptive 08/14/2014  . Hormone disorder     Past Surgical History:  Procedure Laterality Date  . LAPAROSCOPIC OVARIAN CYSTECTOMY Left 2010   follicular cyst, no endometriosis  . MENISCUS REPAIR  12/2012  . TONSILLECTOMY  Age 11     Current Hospital Medications:  Home meds:  No current facility-administered medications on file prior to encounter.   Current Outpatient Medications on File Prior to Encounter  Medication Sig Dispense Refill  . levonorgestrel (MIRENA) 20 MCG/24HR IUD 1 each by Intrauterine route once.    . ondansetron (ZOFRAN-ODT) 4 MG disintegrating tablet Take by mouth.    . pantoprazole (PROTONIX) 40 MG tablet Take 40 mg by mouth 2 (two) times daily.    . propranolol (INDERAL) 20 MG tablet Take 20 mg by mouth 2 (two) times daily.    .  benzonatate (TESSALON) 200 MG capsule Take one cap TID PRN cough (Patient not taking: Reported on 11/27/2020) 30 capsule 0  . chlorpheniramine-HYDROcodone (TUSSIONEX PENNKINETIC ER) 10-8 MG/5ML SUER Take 5 mLs by mouth 2 (two) times daily. (Patient not taking: Reported on 11/27/2020) 115 mL 0  . fluticasone (FLONASE) 50 MCG/ACT nasal spray Place 2 sprays into both nostrils daily. (Patient not taking: Reported on 11/27/2020) 16 g 0     Scheduled Meds: . acetaminophen  500 mg Oral Once  . thiamine  100 mg Intravenous Daily   Continuous Infusions: . piperacillin-tazobactam (ZOSYN)  IV    . potassium chloride 10 mEq (11/27/20 1805)   PRN Meds:.LORazepam **OR** LORazepam  Allergies:  Allergies  Allergen Reactions  . Clonazepam Other (See Comments)    Makes her forget what she did the day before.  . Escitalopram Other (See Comments)    Made her moody and feel crazy  . Iodinated Diagnostic Agents Other (See Comments)    "makes her crazy" "makes her crazy"  . Latex     Family History  Problem Relation Age of Onset  . Ovarian cancer Other        Maternal Great Grandmother  . Breast cancer Paternal Grandmother 31  . Diabetes Father   . Hyperthyroidism Father   . Diabetes Paternal Grandfather   . Diabetes Paternal Uncle   . Endometriosis Mother   . Hypertension Mother     Social History:  reports that she quit smoking about 3 years ago. She has never used smokeless tobacco. She  reports current alcohol use of about 2.0 standard drinks of alcohol per week. She reports that she does not use drugs.  ROS: A complete review of systems was performed.  All systems are negative except for pertinent findings as noted.  Physical Exam:  Vital signs in last 24 hours: Temp:  [99.9 F (37.7 C)-102.6 F (39.2 C)] 101.4 F (38.6 C) (12/02 1741) Pulse Rate:  [88-128] 89 (12/02 1822) Resp:  [15-24] 20 (12/02 1822) BP: (106-154)/(73-104) 124/78 (12/02 1822) SpO2:  [95 %-100 %] 100 % (12/02  1822) Weight:  [64.4 kg] 64.4 kg (12/02 0858) Constitutional:  Alert and oriented, No acute distress Cardiovascular: Regular rate and rhythm Respiratory: Normal respiratory effort, Lungs clear bilaterally GI: Abdomen is soft, nontender, nondistended, no abdominal masses GU: No CVA tenderness Neurologic: Grossly intact, no focal deficits Psychiatric: Normal mood and affect  Laboratory Data:  Recent Labs    11/27/20 0918  WBC 7.5  HGB 14.4  HCT 40.7  PLT 143*    Recent Labs    11/27/20 0918  NA 136  K 2.9*  CL 98  GLUCOSE 155*  BUN <5*  CALCIUM 8.5*  CREATININE 0.86     Results for orders placed or performed during the hospital encounter of 11/27/20 (from the past 24 hour(s))  Lipase, blood     Status: None   Collection Time: 11/27/20  9:18 AM  Result Value Ref Range   Lipase 28 11 - 51 U/L  Comprehensive metabolic panel     Status: Abnormal   Collection Time: 11/27/20  9:18 AM  Result Value Ref Range   Sodium 136 135 - 145 mmol/L   Potassium 2.9 (L) 3.5 - 5.1 mmol/L   Chloride 98 98 - 111 mmol/L   CO2 25 22 - 32 mmol/L   Glucose, Bld 155 (H) 70 - 99 mg/dL   BUN <5 (L) 6 - 20 mg/dL   Creatinine, Ser 1.61 0.44 - 1.00 mg/dL   Calcium 8.5 (L) 8.9 - 10.3 mg/dL   Total Protein 6.4 (L) 6.5 - 8.1 g/dL   Albumin 3.5 3.5 - 5.0 g/dL   AST 096 (H) 15 - 41 U/L   ALT 193 (H) 0 - 44 U/L   Alkaline Phosphatase 218 (H) 38 - 126 U/L   Total Bilirubin 3.2 (H) 0.3 - 1.2 mg/dL   GFR, Estimated >04 >54 mL/min   Anion gap 13 5 - 15  CBC     Status: Abnormal   Collection Time: 11/27/20  9:18 AM  Result Value Ref Range   WBC 7.5 4.0 - 10.5 K/uL   RBC 3.91 3.87 - 5.11 MIL/uL   Hemoglobin 14.4 12.0 - 15.0 g/dL   HCT 09.8 36 - 46 %   MCV 104.1 (H) 80.0 - 100.0 fL   MCH 36.8 (H) 26.0 - 34.0 pg   MCHC 35.4 30.0 - 36.0 g/dL   RDW 11.9 14.7 - 82.9 %   Platelets 143 (L) 150 - 400 K/uL   nRBC 0.0 0.0 - 0.2 %  Urinalysis, Routine w reflex microscopic Urine, Clean Catch     Status:  Abnormal   Collection Time: 11/27/20  9:19 AM  Result Value Ref Range   Color, Urine AMBER (A) YELLOW   APPearance HAZY (A) CLEAR   Specific Gravity, Urine 1.014 1.005 - 1.030   pH 6.0 5.0 - 8.0   Glucose, UA NEGATIVE NEGATIVE mg/dL   Hgb urine dipstick SMALL (A) NEGATIVE   Bilirubin Urine NEGATIVE NEGATIVE  Ketones, ur 20 (A) NEGATIVE mg/dL   Protein, ur 30 (A) NEGATIVE mg/dL   Nitrite NEGATIVE NEGATIVE   Leukocytes,Ua LARGE (A) NEGATIVE   RBC / HPF 6-10 0 - 5 RBC/hpf   WBC, UA >50 (H) 0 - 5 WBC/hpf   Bacteria, UA FEW (A) NONE SEEN   Squamous Epithelial / LPF 0-5 0 - 5   Mucus PRESENT    Hyaline Casts, UA PRESENT    Granular Casts, UA PRESENT   I-Stat beta hCG blood, ED     Status: None   Collection Time: 11/27/20 10:19 AM  Result Value Ref Range   I-stat hCG, quantitative <5.0 <5 mIU/mL   Comment 3          Protime-INR     Status: None   Collection Time: 11/27/20 11:27 AM  Result Value Ref Range   Prothrombin Time 13.9 11.4 - 15.2 seconds   INR 1.1 0.8 - 1.2  Lactic acid, plasma     Status: None   Collection Time: 11/27/20 11:27 AM  Result Value Ref Range   Lactic Acid, Venous 1.3 0.5 - 1.9 mmol/L  Lactic acid, plasma     Status: None   Collection Time: 11/27/20  2:23 PM  Result Value Ref Range   Lactic Acid, Venous 0.8 0.5 - 1.9 mmol/L  Resp Panel by RT-PCR (Flu A&B, Covid) Nasopharyngeal Swab     Status: None   Collection Time: 11/27/20  2:25 PM   Specimen: Nasopharyngeal Swab; Nasopharyngeal(NP) swabs in vial transport medium  Result Value Ref Range   SARS Coronavirus 2 by RT PCR NEGATIVE NEGATIVE   Influenza A by PCR NEGATIVE NEGATIVE   Influenza B by PCR NEGATIVE NEGATIVE   Recent Results (from the past 240 hour(s))  Resp Panel by RT-PCR (Flu A&B, Covid) Nasopharyngeal Swab     Status: None   Collection Time: 11/27/20  2:25 PM   Specimen: Nasopharyngeal Swab; Nasopharyngeal(NP) swabs in vial transport medium  Result Value Ref Range Status   SARS Coronavirus  2 by RT PCR NEGATIVE NEGATIVE Final    Comment: (NOTE) SARS-CoV-2 target nucleic acids are NOT DETECTED.  The SARS-CoV-2 RNA is generally detectable in upper respiratory specimens during the acute phase of infection. The lowest concentration of SARS-CoV-2 viral copies this assay can detect is 138 copies/mL. A negative result does not preclude SARS-Cov-2 infection and should not be used as the sole basis for treatment or other patient management decisions. A negative result may occur with  improper specimen collection/handling, submission of specimen other than nasopharyngeal swab, presence of viral mutation(s) within the areas targeted by this assay, and inadequate number of viral copies(<138 copies/mL). A negative result must be combined with clinical observations, patient history, and epidemiological information. The expected result is Negative.  Fact Sheet for Patients:  BloggerCourse.comhttps://www.fda.gov/media/152166/download  Fact Sheet for Healthcare Providers:  SeriousBroker.ithttps://www.fda.gov/media/152162/download  This test is no t yet approved or cleared by the [place] FDA and  has been authorized for detection and/or diagnosis of SARS-CoV-2 by FDA under an Emergency Use Authorization (EUA). This EUA will remain  in effect (meaning this test can be used) for the duration of the COVID-19 declaration under Section 564(b)(1) of the Act, 21 U.S.C.section 360bbb-3(b)(1), unless the authorization is terminated  or revoked sooner.       Influenza A by PCR NEGATIVE NEGATIVE Final   Influenza B by PCR NEGATIVE NEGATIVE Final    Comment: (NOTE) The Xpert Xpress SARS-CoV-2/FLU/RSV plus assay is intended as an  aid in the diagnosis of influenza from Nasopharyngeal swab specimens and should not be used as a sole basis for treatment. Nasal washings and aspirates are unacceptable for Xpert Xpress SARS-CoV-2/FLU/RSV testing.  Fact Sheet for Patients: BloggerCourse.com  Fact  Sheet for Healthcare Providers: SeriousBroker.it  This test is not yet approved or cleared by the [place] FDA and has been authorized for detection and/or diagnosis of SARS-CoV-2 by FDA under an Emergency Use Authorization (EUA). This EUA will remain in effect (meaning this test can be used) for the duration of the COVID-19 declaration under Section 564(b)(1) of the Act, 21 U.S.C. section 360bbb-3(b)(1), unless the authorization is terminated or revoked.  Performed at [facility], [address] [place], [place] [address]     Renal Function: Recent Labs    11/27/20 2683  CREATININE 0.86   Estimated Creatinine Clearance: 87.9 mL/min (by C-G formula based on SCr of 0.86 mg/dL).  Radiologic Imaging: CT ABDOMEN PELVIS WO CONTRAST  Result Date: 11/27/2020 CLINICAL DATA:  Abdominal pain.  Elevated liver enzymes. EXAM: CT ABDOMEN AND PELVIS WITHOUT CONTRAST TECHNIQUE: Multidetector CT imaging of the abdomen and pelvis was performed following the standard protocol without IV contrast. COMPARISON:  None. FINDINGS: Lower chest: Lung bases clear.  No pleural or pericardial effusion. Hepatobiliary: The liver is markedly hypoattenuating throughout and measures approximately 22 cm craniocaudal. No focal liver lesion is identified. The gallbladder and biliary tree are unremarkable. Pancreas: Unremarkable. No pancreatic ductal dilatation or surrounding inflammatory changes. Spleen: Normal in size without focal abnormality. Adrenals/Urinary Tract: The patient has moderate left hydronephrosis due to a 0.3 cm proximal left ureteral stone. No other urinary tract stones are identified. No focal renal lesion. Urinary bladder appears normal. Adrenal glands appear normal. Stomach/Bowel: Stomach is within normal limits. Appendix appears normal. No evidence of bowel wall thickening, distention, or inflammatory changes. Vascular/Lymphatic: No significant vascular findings are  present. No enlarged abdominal or pelvic lymph nodes. Reproductive: Uterus and bilateral adnexa are unremarkable. IUD noted. Other: None. Musculoskeletal: No acute or focal abnormality. Mild convex left curvature is likely positional. IMPRESSION: Moderate left hydronephrosis due to a 0.3 cm proximal left ureteral stone. Hepatomegaly and severe fatty infiltration of the liver. Electronically Signed   By: Drusilla Kanner M.D.   On: 11/27/2020 15:06   US Abdomen Limited RUQ (LIVER/GB)  Result Date: 11/27/2020 CLINICAL DATA:  Acute right upper quadrant abdominal pain. EXAM: ULTRASOUND ABDOMEN LIMITED RIGHT UPPER QUADRANT COMPARISON:  October 16, 2020. FINDINGS: Gallbladder: No gallstones or wall thickening visualized. No sonographic Murphy sign noted by sonographer. Common bile duct: Diameter: 2 mm which is within normal limits. Liver: No focal lesion identified. Mildly increased echogenicity of hepatic parenchyma is noted suggesting hepatic steatosis. Portal vein is patent on color Doppler imaging with normal direction of blood flow towards the liver. Other: None. IMPRESSION: Probable hepatic steatosis. No other abnormality seen in the right upper quadrant of the abdomen. Electronically Signed   By: Lupita Raider M.D.   On: 11/27/2020 12:59    I independently reviewed the above imaging studies.  Impression/Recommendation: 1. Sepsis due to obstructing left ureteral stone: CT A/P 11/27/2020 revealed 3 mm proximal left ureteral stone associate with moderate left hydronephrosis.  Febrile 102.6, WBC 7.5, creatinine 0.86, urinalysis with negative nitrite, large leukocyte esterase, few bacteria.  -To OR for cystoscopy, left retrograde pyelogram, left ureteral stent placement -Follow-up urine culture -Continue broad-spectrum antibiotics -Medicine admitting to manage sepsis and GI issues -We will need definitive treatment of  stone as outpatient.  Message schedulers to arrange for follow-up  Matt R. Madilyn Cephas  MD 11/27/2020, 6:44 PM  [facility]  Pager: [phone]   CC: Alvira Monday, MD

## 2020-11-27 NOTE — Discharge Instructions (Signed)
   Activity:  You are encouraged to ambulate frequently (about every hour during waking hours) to help prevent blood clots from forming in your legs or lungs.  However, you should not engage in any heavy lifting (> 10-15 lbs), strenuous activity, or straining.   Diet: You should advance your diet as instructed by your physician.  It will be normal to have some bloating, nausea, and abdominal discomfort intermittently.   Prescriptions:  You will be provided a prescription for pain medication to take as needed.  If your pain is not severe enough to require the prescription pain medication, you may take extra strength Tylenol instead which will have less side effects.  You should also take a prescribed stool softener to avoid straining with bowel movements as the prescription pain medication may constipate you.   What to call us about: You should call the office [phone]) if you develop fever > 101 or develop persistent vomiting. Activity:  You are encouraged to ambulate frequently (about every hour during waking hours) to help prevent blood clots from forming in your legs or lungs.  However, you should not engage in any heavy lifting (> 10-15 lbs), strenuous activity, or straining.  You have a left ureteral stent in place. This is temporary and will be removed/exchanged during definitive treatment of stone.

## 2020-11-27 NOTE — ED Provider Notes (Addendum)
[facility] Provider Note   CSN: [id number] Arrival date & time: 11/27/20  [id number]     History Chief Complaint  Patient presents with  . Elevated Hepatic Enzymes    Tonya BrooksLindsey N Holt is a 29 y.o. female.  HPI      Cramping upper abdominal pain last night, today abdomen felt tight when woke and sides hurt but not sure if from tossing and turning last night. When moving pain 7/10 on the sides. Dull nonstop pain when resting for last few days. Haven't been able to keep food down for 2-3 weeks, eating small increments of mashed potatoes, broth Nausea, vomiting 1-7 times per day, usually in AM, not vomiting blood No diarrhea or black or bloody stools Last BM was yesterday AM No fevers at home, temp 100.1 This AM took zofran at 230AM   Saw GI doctor dyspepsia Has not had etoh in a few weeks, prior alcohol abuse.   After I spoke with her GI physician who was concerned she was at risk for withdrawal and sent her to ED for detox Pt then acknowledges she did last drink 2 days ago.  No history of withdrawal. Does not feel anxious, no hallucinations/sensory disturbance. Does acknowledge n.v.headache Symptoms started September  Has had dysuria on and off since September or October, had it for the last 3weeks No vb/discharge   Past Medical History:  Diagnosis Date  . ADHD (attention deficit hyperactivity disorder) 2001  . Anxiety   . Dysmenorrhea treated with oral contraceptive 08/14/2014  . Hormone disorder     Patient Active Problem List   Diagnosis Date Noted  . Dysmenorrhea treated with oral contraceptive 08/14/2014  . Functional ovarian cysts 08/14/2014  . Pelvic pain in female 08/14/2014  . Excessive tanning 08/14/2014  . ASCUS with positive high risk HPV 08/14/2014    Past Surgical History:  Procedure Laterality Date  . LAPAROSCOPIC OVARIAN CYSTECTOMY Left 2010   follicular cyst, no endometriosis  . MENISCUS REPAIR  12/2012  .  TONSILLECTOMY  Age 31     OB History    Gravida  0   Para  0   Term  0   Preterm  0   AB  0   Living  0     SAB  0   TAB  0   Ectopic  0   Multiple  0   Live Births              Family History  Problem Relation Age of Onset  . Ovarian cancer Other        Maternal Great Grandmother  . Breast cancer Paternal Grandmother 1955  . Diabetes Father   . Hyperthyroidism Father   . Diabetes Paternal Grandfather   . Diabetes Paternal Uncle   . Endometriosis Mother   . Hypertension Mother     Social History   Tobacco Use  . Smoking status: Former Smoker    Quit date: 02/2017    Years since quitting: 3.7  . Smokeless tobacco: Never Used  . Tobacco comment: 1 Pack/wk  Vaping Use  . Vaping Use: Former  Substance Use Topics  . Alcohol use: Yes    Alcohol/week: 2.0 standard drinks    Types: 2 Standard drinks or equivalent per week  . Drug use: No    Home Medications Prior to Admission medications   Medication Sig Start Date End Date Taking? Authorizing Provider  levonorgestrel (MIRENA) 20 MCG/24HR IUD 1 each by Intrauterine  route once.   Yes [provider]  ondansetron (ZOFRAN-ODT) 4 MG disintegrating tablet Take by mouth. 11/26/20  Yes [provider]  pantoprazole (PROTONIX) 40 MG tablet Take 40 mg by mouth 2 (two) times daily. 11/26/20  Yes [provider]  propranolol (INDERAL) 20 MG tablet Take 20 mg by mouth 2 (two) times daily. 11/08/20  Yes [provider]  benzonatate (TESSALON) 200 MG capsule Take one cap TID PRN cough Patient not taking: Reported on 11/27/2020 03/23/18   Lutricia Feil, PA-C  chlorpheniramine-HYDROcodone [address] [facility] PENNKINETIC ER) 10-8 MG/5ML SUER Take 5 mLs by mouth 2 (two) times daily. Patient not taking: Reported on 11/27/2020 03/23/18   Ovid Curd P, PA-C  fluticasone [facility]) 50 MCG/ACT nasal spray Place 2 sprays into both nostrils daily. Patient not taking: Reported on 11/27/2020 03/23/18    Lutricia Feil, PA-C    Allergies    Clonazepam, Escitalopram, Iodinated diagnostic agents, and Latex  Review of Systems   Review of Systems  Constitutional: Positive for appetite change and fatigue. Negative for fever.  HENT: Negative for sore throat (from vomiting).   Respiratory: Negative for cough and shortness of breath.   Cardiovascular: Negative for chest pain.  Gastrointestinal: Positive for abdominal pain, nausea and vomiting. Negative for constipation and diarrhea.  Genitourinary: Positive for dysuria. Negative for difficulty urinating.  Musculoskeletal: Positive for back pain.  Skin: Negative for rash.  Neurological: Positive for headaches. Negative for syncope.    Physical Exam Updated Vital Signs BP 119/81   Pulse 95   Temp (!) 101 F (38.3 C) (Axillary)   Resp 17   Ht 5\' 3"  (1.6 m)   Wt 64.4 kg   SpO2 98%   BMI 25.15 kg/m   Physical Exam Vitals and nursing note reviewed.  Constitutional:      General: She is not in acute distress.    Appearance: She is well-developed. She is not diaphoretic.  HENT:     Head: Normocephalic and atraumatic.  Eyes:     Conjunctiva/sclera: Conjunctivae normal.  Cardiovascular:     Rate and Rhythm: Normal rate and regular rhythm.     Heart sounds: Normal heart sounds. No murmur heard.  No friction rub. No gallop.   Pulmonary:     Effort: Pulmonary effort is normal. No respiratory distress.     Breath sounds: Normal breath sounds. No wheezing or rales.  Abdominal:     General: There is no distension.     Palpations: Abdomen is soft.     Tenderness: There is abdominal tenderness (RUQ, pos murphy, LUQ, bilat flank). There is no guarding.  Musculoskeletal:        General: No tenderness.     Cervical back: Normal range of motion.  Skin:    General: Skin is warm and dry.     Findings: No erythema or rash.  Neurological:     Mental Status: She is alert and oriented to person, place, and time.     ED Results /  Procedures / Treatments   Labs (all labs ordered are listed, but only abnormal results are displayed) Labs Reviewed  COMPREHENSIVE METABOLIC PANEL - Abnormal; Notable for the following components:      Result Value   Potassium 2.9 (*)    Glucose, Bld 155 (*)    BUN <5 (*)    Calcium 8.5 (*)    Total Protein 6.4 (*)    AST 669 (*)    ALT 193 (*)  Alkaline Phosphatase 218 (*)    Total Bilirubin 3.2 (*)    All other components within normal limits  CBC - Abnormal; Notable for the following components:   MCV 104.1 (*)    MCH 36.8 (*)    Platelets 143 (*)    All other components within normal limits  URINALYSIS, ROUTINE W REFLEX MICROSCOPIC - Abnormal; Notable for the following components:   Color, Urine AMBER (*)    APPearance HAZY (*)    Hgb urine dipstick SMALL (*)    Ketones, ur 20 (*)    Protein, ur 30 (*)    Leukocytes,Ua LARGE (*)    WBC, UA >50 (*)    Bacteria, UA FEW (*)    All other components within normal limits  RESP PANEL BY RT-PCR (FLU A&B, COVID) ARPGX2  CULTURE, BLOOD (ROUTINE X 2)  CULTURE, BLOOD (ROUTINE X 2)  LIPASE, BLOOD  PROTIME-INR  LACTIC ACID, PLASMA  LACTIC ACID, PLASMA  I-STAT BETA HCG BLOOD, ED ([facility], [facility], [facility] ONLY)    EKG None  Radiology CT ABDOMEN PELVIS WO CONTRAST  Result Date: 11/27/2020 CLINICAL DATA:  Abdominal pain.  Elevated liver enzymes. EXAM: CT ABDOMEN AND PELVIS WITHOUT CONTRAST TECHNIQUE: Multidetector CT imaging of the abdomen and pelvis was performed following the standard protocol without IV contrast. COMPARISON:  None. FINDINGS: Lower chest: Lung bases clear.  No pleural or pericardial effusion. Hepatobiliary: The liver is markedly hypoattenuating throughout and measures approximately 22 cm craniocaudal. No focal liver lesion is identified. The gallbladder and biliary tree are unremarkable. Pancreas: Unremarkable. No pancreatic ductal dilatation or surrounding inflammatory changes. Spleen: Normal in size without focal  abnormality. Adrenals/Urinary Tract: The patient has moderate left hydronephrosis due to a 0.3 cm proximal left ureteral stone. No other urinary tract stones are identified. No focal renal lesion. Urinary bladder appears normal. Adrenal glands appear normal. Stomach/Bowel: Stomach is within normal limits. Appendix appears normal. No evidence of bowel wall thickening, distention, or inflammatory changes. Vascular/Lymphatic: No significant vascular findings are present. No enlarged abdominal or pelvic lymph nodes. Reproductive: Uterus and bilateral adnexa are unremarkable. IUD noted. Other: None. Musculoskeletal: No acute or focal abnormality. Mild convex left curvature is likely positional. IMPRESSION: Moderate left hydronephrosis due to a 0.3 cm proximal left ureteral stone. Hepatomegaly and severe fatty infiltration of the liver. Electronically Signed   By: Drusilla Kanner M.D.   On: 11/27/2020 15:06   US Abdomen Limited RUQ (LIVER/GB)  Result Date: 11/27/2020 CLINICAL DATA:  Acute right upper quadrant abdominal pain. EXAM: ULTRASOUND ABDOMEN LIMITED RIGHT UPPER QUADRANT COMPARISON:  October 16, 2020. FINDINGS: Gallbladder: No gallstones or wall thickening visualized. No sonographic Murphy sign noted by sonographer. Common bile duct: Diameter: 2 mm which is within normal limits. Liver: No focal lesion identified. Mildly increased echogenicity of hepatic parenchyma is noted suggesting hepatic steatosis. Portal vein is patent on color Doppler imaging with normal direction of blood flow towards the liver. Other: None. IMPRESSION: Probable hepatic steatosis. No other abnormality seen in the right upper quadrant of the abdomen. Electronically Signed   By: Lupita Raider M.D.   On: 11/27/2020 12:59    Procedures .Critical Care Performed by: Alvira Monday, MD Authorized by: Alvira Monday, MD   Critical care provider statement:    Critical care time (minutes):  45   Critical care was time spent  personally by me on the following activities:  Discussions with consultants, evaluation of patient's response to treatment, examination of patient, ordering and performing treatments and  interventions, ordering and review of laboratory studies, ordering and review of radiographic studies, pulse oximetry, re-evaluation of patient's condition, obtaining history from patient or surrogate and review of old charts   (including critical care time)  Medications Ordered in ED Medications  piperacillin-tazobactam (ZOSYN) IVPB 3.375 g (has no administration in time range)  acetaminophen (TYLENOL) tablet 500 mg (has no administration in time range)  sodium chloride 0.9 % bolus 1,000 mL (has no administration in time range)  LORazepam (ATIVAN) injection 1 mg (has no administration in time range)  sodium chloride 0.9 % bolus 1,000 mL (0 mLs Intravenous Stopped 11/27/20 1403)  sodium chloride 0.9 % bolus 1,000 mL (0 mLs Intravenous Stopped 11/27/20 1403)  LORazepam (ATIVAN) injection 1 mg (1 mg Intravenous Given 11/27/20 1209)  ondansetron (ZOFRAN) injection 4 mg (4 mg Intravenous Given 11/27/20 1208)  piperacillin-tazobactam (ZOSYN) IVPB 3.375 g (0 g Intravenous Stopped 11/27/20 1403)  acetaminophen (TYLENOL) tablet 500 mg (500 mg Oral Given 11/27/20 1459)    ED Course  I have reviewed the triage vital signs and the nursing notes.  Pertinent labs & imaging results that were available during my care of the patient were reviewed by me and considered in my medical decision making (see chart for details).    MDM Rules/Calculators/A&P                          29 year old female with history of ADHD, recent work-up with gastroenterology at [facility] clinic for transaminitis and symptoms of dyspepsia presents with concern for abdominal pain, nausea, vomiting and found to have fever.  Regarding transaminitis-have been increasing as an outpatient to an AST of 800s, however on repeat today is 600s.  Her bilirubin  however has increased to 3.2.  Her GI provider from [facility] reports that they sent her to the emergency department with desire for detox for her to stop drinking to help with her suspected alcoholic hepatitis.  Reports they were considering also autoimmune hepatitis, but feel her history of etoh use is more consistent with alcoholic.  Patient has tachycardia on exam with multiple possible etiologies and no significant signs of withdrawal-however after receiving Ativan, her heart rate did improve.  Regarding her abdominal pain and fever--these are acute on chronic background of other n/v/abdominal discomfort.  Differential diagnosis includes pyelonephritis, cholecystitis, hepatitis, appendicitis, TOA.    Urinalysis shows greater than 50 white blood cells, concerning for possible urinary source of symptoms--however given severity of pain ordered additional imaging.  Initially with right upper quadrant tenderness/transaminitis, ordered right upper ultrasound which showed no evidence of cholecystitis or cholelithiasis.   CT was obtained which showed evidence of 3 mm obstructing ureteral stone on the left with mild hydronephrosis.  Given patient's fever to 1022.6, evidence of likely urinary tract infection, consulted urology Dr. Benancio Deeds who recommends transfer to [facility] emergency department for further evaluation.  I suspect she will likely require hospital this admission given other complex issues including risk of alcohol withdrawal, transaminitis.  Given zosyn, IV fluids, ativan.    Final Clinical Impression(s) / ED Diagnoses Final diagnoses:  Abdominal pain  Nephrolithiasis  Pyelonephritis  Alcoholic hepatitis without ascites  Fever, unspecified fever cause    Rx / DC Orders ED Discharge Orders    None       Alvira Monday, MD 11/27/20 2114

## 2020-11-27 NOTE — Anesthesia Procedure Notes (Signed)
Procedure Name: LMA Insertion Date/Time: 11/27/2020 7:29 PM Performed by: Vanessa Espanola, CRNA Pre-anesthesia Checklist: Emergency Drugs available, Patient identified, Suction available and Patient being monitored Patient Re-evaluated:Patient Re-evaluated prior to induction Oxygen Delivery Method: Circle system utilized Preoxygenation: Pre-oxygenation with 100% oxygen Induction Type: IV induction Ventilation: Mask ventilation without difficulty LMA: LMA inserted LMA Size: 4.0 Number of attempts: 1 Placement Confirmation: positive ETCO2 and breath sounds checked- equal and bilateral Tube secured with: Tape Dental Injury: Teeth and Oropharynx as per pre-operative assessment

## 2020-11-28 ENCOUNTER — Encounter (HOSPITAL_COMMUNITY): Payer: Self-pay | Admitting: Urology

## 2020-11-28 DIAGNOSIS — A419 Sepsis, unspecified organism: Secondary | ICD-10-CM

## 2020-11-28 LAB — HIV ANTIBODY (ROUTINE TESTING W REFLEX): HIV Screen 4th Generation wRfx: NONREACTIVE

## 2020-11-28 LAB — COMPREHENSIVE METABOLIC PANEL
ALT: 127 U/L — ABNORMAL HIGH (ref 0–44)
AST: 295 U/L — ABNORMAL HIGH (ref 15–41)
Albumin: 2.9 g/dL — ABNORMAL LOW (ref 3.5–5.0)
Alkaline Phosphatase: 151 U/L — ABNORMAL HIGH (ref 38–126)
Anion gap: 10 (ref 5–15)
BUN: <5 mg/dL — ABNORMAL LOW (ref 6–20)
CO2: 21 mmol/L — ABNORMAL LOW (ref 22–32)
Calcium: 7.8 mg/dL — ABNORMAL LOW (ref 8.9–10.3)
Chloride: 106 mmol/L (ref 98–111)
Creatinine, Ser: 0.54 mg/dL (ref 0.44–1.00)
GFR, Estimated: >60 mL/min (ref >60)
Glucose, Bld: 194 mg/dL — ABNORMAL HIGH (ref 70–99)
Potassium: 3.3 mmol/L — ABNORMAL LOW (ref 3.5–5.1)
Sodium: 137 mmol/L (ref 135–145)
Total Bilirubin: 2.7 mg/dL — ABNORMAL HIGH (ref 0.3–1.2)
Total Protein: 5.6 g/dL — ABNORMAL LOW (ref 6.5–8.1)

## 2020-11-28 LAB — CBC
HCT: 34 % — ABNORMAL LOW (ref 36.0–46.0)
Hemoglobin: 11.9 g/dL — ABNORMAL LOW (ref 12.0–15.0)
MCH: 37.3 pg — ABNORMAL HIGH (ref 26.0–34.0)
MCHC: 35 g/dL (ref 30.0–36.0)
MCV: 106.6 fL — ABNORMAL HIGH (ref 80.0–100.0)
Platelets: ADEQUATE 10*3/uL (ref 150–400)
RBC: 3.19 MIL/uL — ABNORMAL LOW (ref 3.87–5.11)
RDW: 13.4 % (ref 11.5–15.5)
WBC: 6.1 10*3/uL (ref 4.0–10.5)
nRBC: 0 % (ref 0.0–0.2)

## 2020-11-28 NOTE — TOC Initial Note (Signed)
Transition of Care [facility]) - Initial/Assessment Note    Patient Details  Name: Tonya Holt MRN: [id number] Date of Birth: 01/03/1991  Transition of Care [facility]) CM/SW Contact:    Tonya Rogue, LCSW Phone Number: 11/28/2020, 11:55 AM  Clinical Narrative:  Patient seen in follow up to MD consult for alcohol use, BAC of 284 at admission.  Tonya Holt was open to talking about her struggles.  Stated she made a deliberate decision to cut back in September based on watching her father who is a "full blown" alcoholic as he sinks deeper into the illness.  Changing her routine has helped, but she admits that she knows she needs more help.  We talked about AA meetings, IOP and inpatient rehab.  She expressed interest in trying AA, and also already knows about [facility] and their IOP program in case she decides to use that resource.  Tonya Holt also talked about a history of depression that has been exacerbated by her grandfather's death-she became tearful when talking about him. She asked for a referral to psychiatry and therapy, which I was able to set up through [facility] in the [facility] system. No further needs identifed. TOC will continue to follow during the course of hospitalization.                 Expected Discharge Plan: Home/Self Care Barriers to Discharge: No Barriers Identified   Patient Goals and CMS Choice Patient states their goals for this hospitalization and ongoing recovery are:: "I know I need to stop."      Expected Discharge Plan and Services Expected Discharge Plan: Home/Self Care In-house Referral: Clinical Social Work     Living arrangements for the past 2 months: Single Family Home                                      Prior Living Arrangements/Services Living arrangements for the past 2 months: Single Family Home Lives with:: Self Patient language and need for interpreter reviewed:: Yes Do you feel safe going back to the place where you live?: Yes      Need for  Family Participation in Patient Care: Yes (Comment) Care giver support system in place?: Yes (comment)   Criminal Activity/Legal Involvement Pertinent to Current Situation/Hospitalization: No - Comment as needed  Activities of Daily Living Home Assistive Devices/Equipment: None ADL Screening (condition at time of admission) Patient's cognitive ability adequate to safely complete daily activities?: Yes Is the patient deaf or have difficulty hearing?: No Does the patient have difficulty seeing, even when wearing glasses/contacts?: No Does the patient have difficulty concentrating, remembering, or making decisions?: No Patient able to express need for assistance with ADLs?: No Does the patient have difficulty dressing or bathing?: No Independently performs ADLs?: Yes (appropriate for developmental age) Does the patient have difficulty walking or climbing stairs?: No Weakness of Legs: None Weakness of Arms/Hands: None  Permission Sought/Granted                  Emotional Assessment Appearance:: Appears older than stated age Attitude/Demeanor/Rapport: Engaged Affect (typically observed): Appropriate Orientation: : Oriented to Self, Oriented to Place, Oriented to  Time, Oriented to Situation Alcohol / Substance Use: Alcohol Use Psych Involvement: No (comment)  Admission diagnosis:  Nephrolithiasis [N20.0] Pyelonephritis [N12] Alcoholic hepatitis without ascites [K70.10] Abdominal pain [R10.9] Sepsis (HCC) [A41.9] Fever, unspecified fever cause [R50.9] Patient Active Problem List  Diagnosis Date Noted  . Sepsis (HCC) 11/27/2020  . Elevated LFTs 11/27/2020  . Alcohol use 11/27/2020  . Left ureteral calculus 11/27/2020  . Dysmenorrhea treated with oral contraceptive 08/14/2014  . Functional ovarian cysts 08/14/2014  . Pelvic pain in female 08/14/2014  . Excessive tanning 08/14/2014  . ASCUS with positive high risk HPV 08/14/2014   PCP:  Marguarite Arbour, MD Pharmacy:    [facility] [phone] - [place], [place] - [address] [place] [address] Phone: [phone] Fax: [phone]  [facility] [address] [place] - [address] [place] [address] Phone: [phone] Fax: [phone]  [id number] [place], [place] - [address] [place] [address] Phone: [phone] Fax: [phone]  [id number] [place], [place] - [address] [place] [address] Phone: [phone] Fax: [phone]     Social Determinants of Health (SDOH) Interventions    Readmission Risk Interventions No flowsheet data found.

## 2020-11-28 NOTE — Progress Notes (Signed)
PROGRESS NOTE    Tonya Holt  KXF:[id number] DOB: 12/14/1991 DOA: 11/27/2020 PCP: Marguarite Arbour, MD   Chief Complain: Abdominal pain   Brief Narrative: Patient is a 29 year old female with history of chronic alcohol abuse, hepatic steatosis with elevated LFTs, ADHD, anxiety who presents to the emergency department for further evaluation of abdominal pain.  She was recently seen by GI physician on 11/26/2020 for the evaluation of abdominal pain, nausea and vomiting.  She was found to have elevated LFTs and was directed to the emergency department.  Patient was also complaining of 3 weeks of dysuria.  She was also having intermittent fever, chills, poor oral intake at home.  CT abdomen/pelvis done on presentation showed moderate left-sided hydronephrosis due to 0.3 cm proximal left ureteral stone.  Urology was consulted and she underwent left-sided ureteral stent placement.  Culture sent.  Started on broad-spectrum antibiotics.  Currently on CIWA protocol  Assessment & Plan:   Principal Problem:   Sepsis (HCC) Active Problems:   Elevated LFTs   Alcohol use   Left ureteral calculus   Sepsis secondary to obstructing left ureteral stone: Status post cystoscopy with left-sided ureteral stent placement by urology.  Sepsis etiology has improved.  Currently on Zosyn.  We will follow up final culture report before tapering antibiotics.  Urology has been following. Currently she is afebrile.  No leukocytosis TOC following for assistance with the rehabilitation for alcohol.  Elevated LFTs/hepatic steatosis: Most likely secondary to chronic alcohol abuse.  Follows with gastroenterology Dr. Norma Fredrickson who has plan for doing endoscopy procedure on 12/08/2020.  Right upper quadrant ultrasound and CT abdomen/pelvis showed hepatic steatosis/fatty infiltration of the liver without acute abnormality.  Enzymes are improving.  Chronic alcohol abuse: Continue thiamine, folic acid.  Counseled for cessation.   On CIWA protocol  Hypokalemia/hypomagnesemia: Being supplemented and monitored.          DVT prophylaxis:SCD Code Status: Full Family Communication: Mother at bedside Status is: Inpatient  Remains inpatient appropriate because:Inpatient level of care appropriate due to severity of illness   Dispo: The patient is from: Home              Anticipated d/c is to: Home              Anticipated d/c date is: 1 day              Patient currently is not medically stable to d/c.      Consultants: Urology  Procedures: Stent placement  Antimicrobials:  Anti-infectives (From admission, onward)   Start     Dose/Rate Route Frequency Ordered Stop   11/27/20 2000  piperacillin-tazobactam (ZOSYN) IVPB 3.375 g        3.375 g 12.5 mL/hr over 240 Minutes Intravenous Every 8 hours 11/27/20 1227     11/27/20 1924  piperacillin-tazobactam (ZOSYN) 3.375 GM/50ML IVPB       Note to Pharmacy: Nadene Rubins   : cabinet override      11/27/20 1924 11/27/20 1940   11/27/20 1230  piperacillin-tazobactam (ZOSYN) IVPB 3.375 g        3.375 g 100 mL/hr over 30 Minutes Intravenous NOW 11/27/20 1227 11/27/20 1403      Subjective: Patient seen and examined at the bedside this afternoon.  Comfortable.  Hemodynamically stable, denies any complaints.  Eager to go home   Objective: Vitals:   11/27/20 2042 11/28/20 0023 11/28/20 0422 11/28/20 1349  BP: 121/73 124/89 116/78 111/83  Pulse: 98 66 (!) 58 96  Resp: 16 18 17 16   Temp: 99.7 F (37.6 C) 98.8 F (37.1 C) 98.3 F (36.8 C) 98.3 F (36.8 C)  TempSrc: Oral Oral  Oral  SpO2: 100% 99% 98% 100%  Weight:      Height:        Intake/Output Summary (Last 24 hours) at 11/28/2020 1539 Last data filed at 11/28/2020 0915 Gross per 24 hour  Intake 502.43 ml  Output 2050 ml  Net -1547.57 ml   Filed Weights   11/27/20 0858  Weight: 64.4 kg    Examination:  General exam: Appears calm and comfortable ,Not in distress,average  built HEENT:PERRL,Oral mucosa moist, Ear/Nose normal on gross exam Respiratory system: Bilateral equal air entry, normal vesicular breath sounds, no wheezes or crackles  Cardiovascular system: S1 & S2 heard, RRR. No JVD, murmurs, rubs, gallops or clicks. No pedal edema. Gastrointestinal system: Abdomen is nondistended, soft and nontender. No organomegaly or masses felt. Normal bowel sounds heard. Central nervous system: Alert and oriented. No focal neurological deficits. Extremities: No edema, no clubbing ,no cyanosis, distal peripheral pulses palpable. Skin: No rashes, lesions or ulcers,no icterus ,no pallor  Data Reviewed: I have personally reviewed following labs and imaging studies  CBC: Recent Labs  Lab 11/27/20 0918 11/28/20 0340  WBC 7.5 6.1  HGB 14.4 11.9*  HCT 40.7 34.0*  MCV 104.1* 106.6*  PLT 143* PLATELET CLUMPS NOTED ON SMEAR, COUNT APPEARS ADEQUATE   Basic Metabolic Panel: Recent Labs  Lab 11/27/20 0918 11/27/20 2132 11/28/20 0340  NA 136  --  137  K 2.9*  --  3.3*  CL 98  --  106  CO2 25  --  21*  GLUCOSE 155*  --  194*  BUN <5*  --  <5*  CREATININE 0.86  --  0.54  CALCIUM 8.5*  --  7.8*  MG  --  1.1*  --    GFR: Estimated Creatinine Clearance: 94.5 mL/min (by C-G formula based on SCr of 0.54 mg/dL). Liver Function Tests: Recent Labs  Lab 11/27/20 0918 11/28/20 0340  AST 669* 295*  ALT 193* 127*  ALKPHOS 218* 151*  BILITOT 3.2* 2.7*  PROT 6.4* 5.6*  ALBUMIN 3.5 2.9*   Recent Labs  Lab 11/27/20 0918  LIPASE 28   No results for input(s): AMMONIA in the last 168 hours. Coagulation Profile: Recent Labs  Lab 11/27/20 1127  INR 1.1   Cardiac Enzymes: No results for input(s): CKTOTAL, CKMB, CKMBINDEX, TROPONINI in the last 168 hours. BNP (last 3 results) No results for input(s): PROBNP in the last 8760 hours. HbA1C: No results for input(s): HGBA1C in the last 72 hours. CBG: No results for input(s): GLUCAP in the last 168 hours. Lipid  Profile: No results for input(s): CHOL, HDL, LDLCALC, TRIG, CHOLHDL, LDLDIRECT in the last 72 hours. Thyroid Function Tests: No results for input(s): TSH, T4TOTAL, FREET4, T3FREE, THYROIDAB in the last 72 hours. Anemia Panel: No results for input(s): VITAMINB12, FOLATE, FERRITIN, TIBC, IRON, RETICCTPCT in the last 72 hours. Sepsis Labs: Recent Labs  Lab 11/27/20 1127 11/27/20 1423  LATICACIDVEN 1.3 0.8    Recent Results (from the past 240 hour(s))  Blood culture (routine x 2)     Status: None (Preliminary result)   Collection Time: 11/27/20 11:26 AM   Specimen: BLOOD  Result Value Ref Range Status   Specimen Description BLOOD RIGHT ANTECUBITAL  Final   Special Requests   Final    BOTTLES DRAWN AEROBIC AND ANAEROBIC Blood Culture adequate volume  Culture   Final    NO GROWTH < 24 HOURS Performed at [facility], [address] [place], [place] [address]    Report Status PENDING  Incomplete  Blood culture (routine x 2)     Status: None (Preliminary result)   Collection Time: 11/27/20 11:31 AM   Specimen: BLOOD  Result Value Ref Range Status   Specimen Description BLOOD LEFT ANTECUBITAL  Final   Special Requests   Final    BOTTLES DRAWN AEROBIC AND ANAEROBIC Blood Culture adequate volume   Culture   Final    NO GROWTH < 24 HOURS Performed at [facility], [address] [place], [place] [address]    Report Status PENDING  Incomplete  Resp Panel by RT-PCR (Flu A&B, Covid) Nasopharyngeal Swab     Status: None   Collection Time: 11/27/20  2:25 PM   Specimen: Nasopharyngeal Swab; Nasopharyngeal(NP) swabs in vial transport medium  Result Value Ref Range Status   SARS Coronavirus 2 by RT PCR NEGATIVE NEGATIVE Final    Comment: (NOTE) SARS-CoV-2 target nucleic acids are NOT DETECTED.  The SARS-CoV-2 RNA is generally detectable in upper respiratory specimens during the acute phase of infection. The lowest concentration of SARS-CoV-2 viral copies this assay can  detect is 138 copies/mL. A negative result does not preclude SARS-Cov-2 infection and should not be used as the sole basis for treatment or other patient management decisions. A negative result may occur with  improper specimen collection/handling, submission of specimen other than nasopharyngeal swab, presence of viral mutation(s) within the areas targeted by this assay, and inadequate number of viral copies(<138 copies/mL). A negative result must be combined with clinical observations, patient history, and epidemiological information. The expected result is Negative.  Fact Sheet for Patients:  BloggerCourse.comhttps://www.fda.gov/media/152166/download  Fact Sheet for Healthcare Providers:  SeriousBroker.ithttps://www.fda.gov/media/152162/download  This test is no t yet approved or cleared by the [place] FDA and  has been authorized for detection and/or diagnosis of SARS-CoV-2 by FDA under an Emergency Use Authorization (EUA). This EUA will remain  in effect (meaning this test can be used) for the duration of the COVID-19 declaration under Section 564(b)(1) of the Act, 21 U.S.C.section 360bbb-3(b)(1), unless the authorization is terminated  or revoked sooner.       Influenza A by PCR NEGATIVE NEGATIVE Final   Influenza B by PCR NEGATIVE NEGATIVE Final    Comment: (NOTE) The Xpert Xpress SARS-CoV-2/FLU/RSV plus assay is intended as an aid in the diagnosis of influenza from Nasopharyngeal swab specimens and should not be used as a sole basis for treatment. Nasal washings and aspirates are unacceptable for Xpert Xpress SARS-CoV-2/FLU/RSV testing.  Fact Sheet for Patients: BloggerCourse.comhttps://www.fda.gov/media/152166/download  Fact Sheet for Healthcare Providers: SeriousBroker.ithttps://www.fda.gov/media/152162/download  This test is not yet approved or cleared by the [place] FDA and has been authorized for detection and/or diagnosis of SARS-CoV-2 by FDA under an Emergency Use Authorization (EUA). This EUA will remain in  effect (meaning this test can be used) for the duration of the COVID-19 declaration under Section 564(b)(1) of the Act, 21 U.S.C. section 360bbb-3(b)(1), unless the authorization is terminated or revoked.  Performed at [facility], [address] [place], [place] [address]          Radiology Studies: CT ABDOMEN PELVIS WO CONTRAST  Result Date: 11/27/2020 CLINICAL DATA:  Abdominal pain.  Elevated liver enzymes. EXAM: CT ABDOMEN AND PELVIS WITHOUT CONTRAST TECHNIQUE: Multidetector CT imaging of the abdomen and pelvis was performed  following the standard protocol without IV contrast. COMPARISON:  None. FINDINGS: Lower chest: Lung bases clear.  No pleural or pericardial effusion. Hepatobiliary: The liver is markedly hypoattenuating throughout and measures approximately 22 cm craniocaudal. No focal liver lesion is identified. The gallbladder and biliary tree are unremarkable. Pancreas: Unremarkable. No pancreatic ductal dilatation or surrounding inflammatory changes. Spleen: Normal in size without focal abnormality. Adrenals/Urinary Tract: The patient has moderate left hydronephrosis due to a 0.3 cm proximal left ureteral stone. No other urinary tract stones are identified. No focal renal lesion. Urinary bladder appears normal. Adrenal glands appear normal. Stomach/Bowel: Stomach is within normal limits. Appendix appears normal. No evidence of bowel wall thickening, distention, or inflammatory changes. Vascular/Lymphatic: No significant vascular findings are present. No enlarged abdominal or pelvic lymph nodes. Reproductive: Uterus and bilateral adnexa are unremarkable. IUD noted. Other: None. Musculoskeletal: No acute or focal abnormality. Mild convex left curvature is likely positional. IMPRESSION: Moderate left hydronephrosis due to a 0.3 cm proximal left ureteral stone. Hepatomegaly and severe fatty infiltration of the liver. Electronically Signed   By: Drusilla Kanner M.D.   On: 11/27/2020  15:06   DG C-Arm 1-60 Min-No Report  Result Date: 11/27/2020 Fluoroscopy was utilized by the requesting physician.  No radiographic interpretation.   US Abdomen Limited RUQ (LIVER/GB)  Result Date: 11/27/2020 CLINICAL DATA:  Acute right upper quadrant abdominal pain. EXAM: ULTRASOUND ABDOMEN LIMITED RIGHT UPPER QUADRANT COMPARISON:  October 16, 2020. FINDINGS: Gallbladder: No gallstones or wall thickening visualized. No sonographic Murphy sign noted by sonographer. Common bile duct: Diameter: 2 mm which is within normal limits. Liver: No focal lesion identified. Mildly increased echogenicity of hepatic parenchyma is noted suggesting hepatic steatosis. Portal vein is patent on color Doppler imaging with normal direction of blood flow towards the liver. Other: None. IMPRESSION: Probable hepatic steatosis. No other abnormality seen in the right upper quadrant of the abdomen. Electronically Signed   By: Lupita Raider M.D.   On: 11/27/2020 12:59        Scheduled Meds: . acetaminophen  500 mg Oral Once  . chlordiazePOXIDE  25 mg Oral QID   Followed by  . [START ON 11/29/2020] chlordiazePOXIDE  25 mg Oral TID   Followed by  . [START ON 11/30/2020] chlordiazePOXIDE  25 mg Oral BH-qamhs   Followed by  . [START ON 12/01/2020] chlordiazePOXIDE  25 mg Oral Daily  . folic acid  1 mg Oral Daily  . multivitamin with minerals  1 tablet Oral Daily  . pantoprazole  40 mg Oral BID  . thiamine  100 mg Oral Daily   Or  . thiamine  100 mg Intravenous Daily   Continuous Infusions: . piperacillin-tazobactam (ZOSYN)  IV 3.375 g (11/28/20 1110)     LOS: 1 day    Time spent: 25 mins.More than 50% of that time was spent in counseling and/or coordination of care.      Burnadette Pop, MD [facility] P12/02/2020, 3:39 PM

## 2020-11-29 DIAGNOSIS — A419 Sepsis, unspecified organism: Secondary | ICD-10-CM | POA: Diagnosis not present

## 2020-11-29 LAB — CBC WITH DIFFERENTIAL/PLATELET
Abs Immature Granulocytes: 0.01 10*3/uL (ref 0.00–0.07)
Basophils Absolute: 0 10*3/uL (ref 0.0–0.1)
Basophils Relative: 1 %
Eosinophils Absolute: 0.1 10*3/uL (ref 0.0–0.5)
Eosinophils Relative: 1 %
HCT: 34.9 % — ABNORMAL LOW (ref 36.0–46.0)
Hemoglobin: 12 g/dL (ref 12.0–15.0)
Immature Granulocytes: 0 %
Lymphocytes Relative: 34 %
Lymphs Abs: 1.8 10*3/uL (ref 0.7–4.0)
MCH: 37.9 pg — ABNORMAL HIGH (ref 26.0–34.0)
MCHC: 34.4 g/dL (ref 30.0–36.0)
MCV: 110.1 fL — ABNORMAL HIGH (ref 80.0–100.0)
Monocytes Absolute: 0.4 10*3/uL (ref 0.1–1.0)
Monocytes Relative: 8 %
Neutro Abs: 3 10*3/uL (ref 1.7–7.7)
Neutrophils Relative %: 56 %
Platelets: 95 10*3/uL — ABNORMAL LOW (ref 150–400)
RBC: 3.17 MIL/uL — ABNORMAL LOW (ref 3.87–5.11)
RDW: 13.6 % (ref 11.5–15.5)
WBC: 5.3 10*3/uL (ref 4.0–10.5)
nRBC: 0 % (ref 0.0–0.2)

## 2020-11-29 LAB — COMPREHENSIVE METABOLIC PANEL
ALT: 82 U/L — ABNORMAL HIGH (ref 0–44)
AST: 108 U/L — ABNORMAL HIGH (ref 15–41)
Albumin: 2.6 g/dL — ABNORMAL LOW (ref 3.5–5.0)
Alkaline Phosphatase: 144 U/L — ABNORMAL HIGH (ref 38–126)
Anion gap: 11 (ref 5–15)
BUN: <5 mg/dL — ABNORMAL LOW (ref 6–20)
CO2: 25 mmol/L (ref 22–32)
Calcium: 7.9 mg/dL — ABNORMAL LOW (ref 8.9–10.3)
Chloride: 104 mmol/L (ref 98–111)
Creatinine, Ser: 0.63 mg/dL (ref 0.44–1.00)
GFR, Estimated: >60 mL/min (ref >60)
Glucose, Bld: 91 mg/dL (ref 70–99)
Potassium: 2.8 mmol/L — ABNORMAL LOW (ref 3.5–5.1)
Sodium: 140 mmol/L (ref 135–145)
Total Bilirubin: 2.5 mg/dL — ABNORMAL HIGH (ref 0.3–1.2)
Total Protein: 5.3 g/dL — ABNORMAL LOW (ref 6.5–8.1)

## 2020-11-29 LAB — URINE CULTURE: Culture: <10000 — AB

## 2020-11-29 LAB — MAGNESIUM: Magnesium: 2 mg/dL (ref 1.7–2.4)

## 2020-11-29 MED ORDER — CHLORDIAZEPOXIDE HCL 25 MG PO CAPS: 25.0000 mg | ORAL_CAPSULE | Freq: Three times a day (TID) | ORAL | 0 refills | Status: AC | PRN

## 2020-11-29 MED ORDER — POTASSIUM CHLORIDE ER 20 MEQ PO TBCR: 20.0000 meq | EXTENDED_RELEASE_TABLET | Freq: Every day | ORAL | 0 refills | Status: DC

## 2020-11-29 MED ORDER — FOLIC ACID 1 MG PO TABS: 1.0000 mg | ORAL_TABLET | Freq: Every day | ORAL | 1 refills | Status: AC

## 2020-11-29 MED ORDER — THIAMINE HCL 100 MG PO TABS
100.0000 mg | ORAL_TABLET | Freq: Every day | ORAL | 1 refills | Status: DC
Start: 2020-11-30 — End: 2022-02-17

## 2020-11-29 MED ORDER — POTASSIUM CHLORIDE CRYS ER 20 MEQ PO TBCR: 40.0000 meq | EXTENDED_RELEASE_TABLET | Freq: Two times a day (BID) | ORAL | Status: DC

## 2020-11-29 MED ORDER — POTASSIUM CHLORIDE CRYS ER 20 MEQ PO TBCR
40.0000 meq | EXTENDED_RELEASE_TABLET | ORAL | Status: AC
  Administered 2020-11-29 (×3): 40 meq via ORAL
  Filled 2020-11-29 (×3): qty 2

## 2020-11-29 MED ORDER — CIPROFLOXACIN HCL 500 MG PO TABS: 500.0000 mg | ORAL_TABLET | Freq: Two times a day (BID) | ORAL | 0 refills | Status: AC

## 2020-11-29 NOTE — Discharge Summary (Signed)
Physician Discharge Summary  TASHAI CATINO WUJ:[id number] DOB: 06-Mar-1991 DOA: 11/27/2020  PCP: Marguarite Arbour, MD  Admit date: 11/27/2020 Discharge date: 11/29/2020  Admitted From: Home Disposition:  Home  Discharge Condition:Stable CODE STATUS:FULL Diet recommendation:  Regular  Brief/Interim Summary:  Patient is a 29 year old female with history of chronic alcohol abuse, hepatic steatosis with elevated LFTs, ADHD, anxiety who presents to the emergency department for further evaluation of abdominal pain.  She was recently seen by GI physician on 11/26/2020 for the evaluation of abdominal pain, nausea and vomiting.  She was found to have elevated LFTs and was directed to the emergency department.  Patient was also complaining of 3 weeks of dysuria.  She was also having intermittent fever, chills, poor oral intake at home.  CT abdomen/pelvis done on presentation showed moderate left-sided hydronephrosis due to 0.3 cm proximal left ureteral stone.  Urology was consulted and she underwent left-sided ureteral stent placement.  Culture sent ,which is showing 10,000 colonies of gram-negative rods.  Currently she is hemodynamically stable, not in withdrawal.  She will be  discharged on oral antibiotics and needs  to follow-up with urology as an outpatient.  Her liver enzymes have significantly improved.  Following problems were addressed during her hospitalization:  Sepsis secondary to obstructing left ureteral stone: Status post cystoscopy with left-sided ureteral stent placement by urology.  Sepsis etiology has improved.  She was on  Zosyn.  Culture sent ,which is showing 10,000 colonies of gram-negative rods.  Currently she is hemodynamically stable, not in withdrawal.  She will be  discharged on oral antibiotics and needs to follow-up with urology as an outpatient.  Elevated LFTs/hepatic steatosis: Most likely secondary to chronic alcohol abuse.  Follows with gastroenterology Dr. Norma Fredrickson who has  plan for doing endoscopy procedure on 12/08/2020.  Right upper quadrant ultrasound and CT abdomen/pelvis showed hepatic steatosis/fatty infiltration of the liver without acute abnormality.  Enzymes have improved.  Chronic alcohol abuse: Continue thiamine, folic acid.  Counseled for cessation.  On CIWA protocol.  We have provided resources for alcohol rehabilitation.  Hypokalemia/hypomagnesemia: Supplemented.  Continue supplementation at home.   Discharge Diagnoses:  Principal Problem:   Sepsis (HCC) Active Problems:   Elevated LFTs   Alcohol use   Left ureteral calculus    Discharge Instructions  Discharge Instructions    Diet - low sodium heart healthy   Complete by: As directed    Discharge instructions   Complete by: As directed    1)Please take prescribed medication as instructed 2)Follow up with urology as an outpatient in a week.  Name and number the provider group has been attached 3)Please stop taking alcohol.  Follow-up with alcohol rehabilitation center.   Increase activity slowly   Complete by: As directed      Allergies as of 11/29/2020      Reactions   Clonazepam Other (See Comments)   Makes her forget what she did the day before.   Escitalopram Other (See Comments)   Made her moody and feel crazy   Iodinated Diagnostic Agents Other (See Comments)   "makes her crazy" "makes her crazy"   Latex       Medication List    TAKE these medications   chlordiazePOXIDE 25 MG capsule Commonly known as: LIBRIUM Take 1 capsule (25 mg total) by mouth 3 (three) times daily as needed for up to 2 days for anxiety.   ciprofloxacin 500 MG tablet Commonly known as: Cipro Take 1 tablet (500 mg total) by  mouth 2 (two) times daily for 5 days.   folic acid 1 MG tablet Commonly known as: FOLVITE Take 1 tablet (1 mg total) by mouth daily. Start taking on: November 30, 2020   levonorgestrel 20 MCG/24HR IUD Commonly known as: MIRENA 1 each by Intrauterine route once.    ondansetron 4 MG disintegrating tablet Commonly known as: ZOFRAN-ODT Take by mouth.   pantoprazole 40 MG tablet Commonly known as: PROTONIX Take 40 mg by mouth 2 (two) times daily.   Potassium Chloride ER 20 MEQ Tbcr Take 20 mEq by mouth daily. Start taking on: November 30, 2020   propranolol 20 MG tablet Commonly known as: INDERAL Take 20 mg by mouth 2 (two) times daily.   thiamine 100 MG tablet Take 1 tablet (100 mg total) by mouth daily. Start taking on: November 30, 2020       Follow-up Information    Schedule an appointment as soon as possible for a visit with [facility].   Contact information: [address] [place] [address] [phone]       [facility] Follow up on 12/16/2020.   Specialty: Behavioral Health Why: Tuesday at 2:00.  This will be a virtual appointment with a therapist-Lorna Judeen Hammans Then, Dr Elna Breslow, psychiatrist, January 4 at [facility] information: [address] [facility] [place] [address] [phone]       [facility] Follow [facility].   Why: This is the other clinic I was talking about that may be able to see you sooner if you feel you cannot wait Contact information: [address] [place] [address] [phone]              Allergies  Allergen Reactions  . Clonazepam Other (See Comments)    Makes her forget what she did the day before.  . Escitalopram Other (See Comments)    Made her moody and feel crazy  . Iodinated Diagnostic Agents Other (See Comments)    "makes her crazy" "makes her crazy"  . Latex     Consultations:  Urology   Procedures/Studies: CT ABDOMEN PELVIS WO CONTRAST  Result Date: 11/27/2020 CLINICAL DATA:  Abdominal pain.  Elevated liver enzymes. EXAM: CT ABDOMEN AND PELVIS WITHOUT CONTRAST TECHNIQUE: Multidetector CT imaging of the abdomen and pelvis was performed  following the standard protocol without IV contrast. COMPARISON:  None. FINDINGS: Lower chest: Lung bases clear.  No pleural or pericardial effusion. Hepatobiliary: The liver is markedly hypoattenuating throughout and measures approximately 22 cm craniocaudal. No focal liver lesion is identified. The gallbladder and biliary tree are unremarkable. Pancreas: Unremarkable. No pancreatic ductal dilatation or surrounding inflammatory changes. Spleen: Normal in size without focal abnormality. Adrenals/Urinary Tract: The patient has moderate left hydronephrosis due to a 0.3 cm proximal left ureteral stone. No other urinary tract stones are identified. No focal renal lesion. Urinary bladder appears normal. Adrenal glands appear normal. Stomach/Bowel: Stomach is within normal limits. Appendix appears normal. No evidence of bowel wall thickening, distention, or inflammatory changes. Vascular/Lymphatic: No significant vascular findings are present. No enlarged abdominal or pelvic lymph nodes. Reproductive: Uterus and bilateral adnexa are unremarkable. IUD noted. Other: None. Musculoskeletal: No acute or focal abnormality. Mild convex left curvature is likely positional. IMPRESSION: Moderate left hydronephrosis due to a 0.3 cm proximal left ureteral stone. Hepatomegaly and severe fatty infiltration of the liver. Electronically Signed   By: Drusilla Kanner M.D.  On: 11/27/2020 15:06   DG C-Arm 1-60 Min-No Report  Result Date: 11/27/2020 Fluoroscopy was utilized by the requesting physician.  No radiographic interpretation.   US Abdomen Limited RUQ (LIVER/GB)  Result Date: 11/27/2020 CLINICAL DATA:  Acute right upper quadrant abdominal pain. EXAM: ULTRASOUND ABDOMEN LIMITED RIGHT UPPER QUADRANT COMPARISON:  October 16, 2020. FINDINGS: Gallbladder: No gallstones or wall thickening visualized. No sonographic Murphy sign noted by sonographer. Common bile duct: Diameter: 2 mm which is within normal limits. Liver: No focal  lesion identified. Mildly increased echogenicity of hepatic parenchyma is noted suggesting hepatic steatosis. Portal vein is patent on color Doppler imaging with normal direction of blood flow towards the liver. Other: None. IMPRESSION: Probable hepatic steatosis. No other abnormality seen in the right upper quadrant of the abdomen. Electronically Signed   By: Lupita Raider M.D.   On: 11/27/2020 12:59       Subjective: Patient seen and examined at the bedside this morning.  Hemodynamically stable for discharge today.  Discharge Exam: Vitals:   11/28/20 2130 11/29/20 0455  BP:  118/81  Pulse:  79  Resp:  14  Temp: 98.8 F (37.1 C) 98.6 F (37 C)  SpO2:  98%   Vitals:   11/28/20 1349 11/28/20 2034 11/28/20 2130 11/29/20 0455  BP: 111/83 124/79  118/81  Pulse: 96 97  79  Resp: Temp: 98.3 F (36.8 C) (!) 100.6 F (38.1 C) 98.8 F (37.1 C) 98.6 F (37 C)  TempSrc: Oral Oral Oral Oral  SpO2: 100% 99%  98%  Weight:      Height:        General: Pt is alert, awake, not in acute distress Cardiovascular: RRR, S1/S2 +, no rubs, no gallops Respiratory: CTA bilaterally, no wheezing, no rhonchi Abdominal: Soft, NT, ND, bowel sounds + Extremities: no edema, no cyanosis    The results of significant diagnostics from this hospitalization (including imaging, microbiology, ancillary and laboratory) are listed below for reference.     Microbiology: Recent Results (from the past 240 hour(s))  Culture, Urine     Status: Abnormal   Collection Time: 11/27/20 12:42 AM   Specimen: Urine, Random  Result Value Ref Range Status   Specimen Description   Final    URINE, RANDOM Performed at [facility], [address], [place], [place] [address]    Special Requests   Final    NONE Performed at [facility], [address], [place], [place] [address]    Culture (A)  Final    <10,000 COLONIES/mL INSIGNIFICANT GROWTH Performed at [facility], [address], [place], [place] [address]    Report Status 11/29/2020 FINAL  Final  Blood culture (routine x 2)     Status: None (Preliminary result)   Collection Time: 11/27/20 11:26 AM   Specimen: BLOOD  Result Value Ref Range Status   Specimen Description BLOOD RIGHT ANTECUBITAL  Final   Special Requests   Final    BOTTLES DRAWN AEROBIC AND ANAEROBIC Blood Culture adequate volume   Culture   Final    NO GROWTH 2 DAYS Performed at [facility], [address] [place], [place] [address]    Report Status PENDING  Incomplete  Blood culture (routine x 2)     Status: None (Preliminary result)   Collection Time: 11/27/20 11:31 AM   Specimen: BLOOD  Result Value Ref Range Status   Specimen Description BLOOD LEFT ANTECUBITAL  Final   Special Requests   Final    BOTTLES DRAWN AEROBIC AND ANAEROBIC Blood Culture adequate volume   Culture   Final    NO GROWTH 2 DAYS Performed at [facility], [address], [place], [place] [address]    Report Status PENDING  Incomplete  Resp Panel by RT-PCR (Flu A&B, Covid) Nasopharyngeal Swab     Status: None   Collection Time: 11/27/20  2:25 PM   Specimen: Nasopharyngeal Swab; Nasopharyngeal(NP) swabs in vial transport medium  Result Value Ref Range Status   SARS Coronavirus 2 by RT PCR NEGATIVE NEGATIVE Final    Comment: (NOTE) SARS-CoV-2 target nucleic acids are NOT DETECTED.  The SARS-CoV-2 RNA is generally detectable in upper respiratory specimens during the acute phase of infection. The lowest concentration of SARS-CoV-2 viral copies this assay can detect is 138 copies/mL. A negative result does not preclude SARS-Cov-2 infection and should not be used as the sole basis for treatment or other patient management decisions. A negative result may occur with  improper specimen collection/handling, submission of specimen other than nasopharyngeal swab, presence of viral mutation(s) within the areas targeted by this  assay, and inadequate number of viral copies(<138 copies/mL). A negative result must be combined with clinical observations, patient history, and epidemiological information. The expected result is Negative.  Fact Sheet for Patients:  BloggerCourse.com  Fact Sheet for Healthcare Providers:  SeriousBroker.it  This test is no t yet approved or cleared by the [place] FDA and  has been authorized for detection and/or diagnosis of SARS-CoV-2 by FDA under an Emergency Use Authorization (EUA). This EUA will remain  in effect (meaning this test can be used) for the duration of the COVID-19 declaration under Section 564(b)(1) of the Act, 21 U.S.C.section 360bbb-3(b)(1), unless the authorization is terminated  or revoked sooner.       Influenza A by PCR NEGATIVE NEGATIVE Final   Influenza B by PCR NEGATIVE NEGATIVE Final    Comment: (NOTE) The Xpert Xpress SARS-CoV-2/FLU/RSV plus assay is intended as an aid in the diagnosis of influenza from Nasopharyngeal swab specimens and should not be used as a sole basis for treatment. Nasal washings and aspirates are unacceptable for Xpert Xpress SARS-CoV-2/FLU/RSV testing.  Fact Sheet for Patients: BloggerCourse.com  Fact Sheet for Healthcare Providers: SeriousBroker.it  This test is not yet approved or cleared by the [place] FDA and has been authorized for detection and/or diagnosis of SARS-CoV-2 by FDA under an Emergency Use Authorization (EUA). This EUA will remain in effect (meaning this test can be used) for the duration of the COVID-19 declaration under Section 564(b)(1) of the Act, 21 U.S.C. section 360bbb-3(b)(1), unless the authorization is terminated or revoked.  Performed at [facility], [address] [place], [place] [address]   Urine Culture     Status: Abnormal (Preliminary result)   Collection Time:  11/27/20  7:41 PM   Specimen: PATH Cytology Urine  Result Value Ref Range Status   Specimen Description   Final    CYSTOSCOPY Performed at [facility], [address] [place], [place] [address]    Special Requests   Final    LEFT RENAL PELVIS Performed at [facility], [address] [place], [place] [address]    Culture 10,000 COLONIES/mL GRAM NEGATIVE RODS (A)  Final   Report Status PENDING  Incomplete     Labs: BNP (last 3 results) No results for input(s): BNP in the last 8760  hours. Basic Metabolic Panel: Recent Labs  Lab 11/27/20 0918 11/27/20 2132 11/28/20 0340 11/29/20 0513  NA 136  --  137 140  K 2.9*  --  3.3* 2.8*  CL 98  --  106 104  CO2 25  --  21* 25  GLUCOSE 155*  --  194* 91  BUN <5*  --  <5* <5*  CREATININE 0.86  --  0.54 0.63  CALCIUM 8.5*  --  7.8* 7.9*  MG  --  1.1*  --  2.0   Liver Function Tests: Recent Labs  Lab 11/27/20 0918 11/28/20 0340 11/29/20 0513  AST 669* 295* 108*  ALT 193* 127* 82*  ALKPHOS 218* 151* 144*  BILITOT 3.2* 2.7* 2.5*  PROT 6.4* 5.6* 5.3*  ALBUMIN 3.5 2.9* 2.6*   Recent Labs  Lab 11/27/20 0918  LIPASE 28   No results for input(s): AMMONIA in the last 168 hours. CBC: Recent Labs  Lab 11/27/20 0918 11/28/20 0340 11/29/20 0513  WBC 7.5 6.1 5.3  NEUTROABS  --   --  3.0  HGB 14.4 11.9* 12.0  HCT 40.7 34.0* 34.9*  MCV 104.1* 106.6* 110.1*  PLT 143* PLATELET CLUMPS NOTED ON SMEAR, COUNT APPEARS ADEQUATE 95*   Cardiac Enzymes: No results for input(s): CKTOTAL, CKMB, CKMBINDEX, TROPONINI in the last 168 hours. BNP: Invalid input(s): POCBNP CBG: No results for input(s): GLUCAP in the last 168 hours. D-Dimer No results for input(s): DDIMER in the last 72 hours. Hgb A1c No results for input(s): HGBA1C in the last 72 hours. Lipid Profile No results for input(s): CHOL, HDL, LDLCALC, TRIG, CHOLHDL, LDLDIRECT in the last 72 hours. Thyroid function studies No results for  input(s): TSH, T4TOTAL, T3FREE, THYROIDAB in the last 72 hours.  Invalid input(s): FREET3 Anemia work up No results for input(s): VITAMINB12, FOLATE, FERRITIN, TIBC, IRON, RETICCTPCT in the last 72 hours. Urinalysis    Component Value Date/Time   COLORURINE AMBER (A) 11/27/2020 0919   APPEARANCEUR HAZY (A) 11/27/2020 0919   LABSPEC 1.014 11/27/2020 0919   PHURINE 6.0 11/27/2020 0919   GLUCOSEU NEGATIVE 11/27/2020 0919   HGBUR SMALL (A) 11/27/2020 0919   BILIRUBINUR NEGATIVE 11/27/2020 0919   BILIRUBINUR - 08/14/2014 0921   KETONESUR 20 (A) 11/27/2020 0919   PROTEINUR 30 (A) 11/27/2020 0919   UROBILINOGEN negative 08/14/2014 0921   NITRITE NEGATIVE 11/27/2020 0919   LEUKOCYTESUR LARGE (A) 11/27/2020 0919   Sepsis Labs Invalid input(s): PROCALCITONIN,  WBC,  LACTICIDVEN Microbiology Recent Results (from the past 240 hour(s))  Culture, Urine     Status: Abnormal   Collection Time: 11/27/20 12:42 AM   Specimen: Urine, Random  Result Value Ref Range Status   Specimen Description   Final    URINE, RANDOM Performed at [facility], [address] [place], [place] [address]    Special Requests   Final    NONE Performed at [facility], [address] [place], [place] [address]    Culture (A)  Final    <10,000 COLONIES/mL INSIGNIFICANT GROWTH Performed at [facility], [address] [place], [place] [address]    Report Status 11/29/2020 FINAL  Final  Blood culture (routine x 2)     Status: None (Preliminary result)   Collection Time: 11/27/20 11:26 AM   Specimen: BLOOD  Result Value Ref Range Status   Specimen Description BLOOD RIGHT ANTECUBITAL  Final   Special Requests   Final    BOTTLES DRAWN AEROBIC AND ANAEROBIC Blood  Culture adequate volume   Culture   Final    NO GROWTH 2 DAYS Performed at [facility], [address] [place], [place] [address]    Report Status PENDING  Incomplete  Blood culture (routine x 2)      Status: None (Preliminary result)   Collection Time: 11/27/20 11:31 AM   Specimen: BLOOD  Result Value Ref Range Status   Specimen Description BLOOD LEFT ANTECUBITAL  Final   Special Requests   Final    BOTTLES DRAWN AEROBIC AND ANAEROBIC Blood Culture adequate volume   Culture   Final    NO GROWTH 2 DAYS Performed at [facility], [address], [place], [place] [address]    Report Status PENDING  Incomplete  Resp Panel by RT-PCR (Flu A&B, Covid) Nasopharyngeal Swab     Status: None   Collection Time: 11/27/20  2:25 PM   Specimen: Nasopharyngeal Swab; Nasopharyngeal(NP) swabs in vial transport medium  Result Value Ref Range Status   SARS Coronavirus 2 by RT PCR NEGATIVE NEGATIVE Final    Comment: (NOTE) SARS-CoV-2 target nucleic acids are NOT DETECTED.  The SARS-CoV-2 RNA is generally detectable in upper respiratory specimens during the acute phase of infection. The lowest concentration of SARS-CoV-2 viral copies this assay can detect is 138 copies/mL. A negative result does not preclude SARS-Cov-2 infection and should not be used as the sole basis for treatment or other patient management decisions. A negative result may occur with  improper specimen collection/handling, submission of specimen other than nasopharyngeal swab, presence of viral mutation(s) within the areas targeted by this assay, and inadequate number of viral copies(<138 copies/mL). A negative result must be combined with clinical observations, patient history, and epidemiological information. The expected result is Negative.  Fact Sheet for Patients:  BloggerCourse.com  Fact Sheet for Healthcare Providers:  SeriousBroker.it  This test is no t yet approved or cleared by the [place] FDA and  has been authorized for detection and/or diagnosis of SARS-CoV-2 by FDA under an Emergency Use Authorization (EUA). This EUA will remain  in effect  (meaning this test can be used) for the duration of the COVID-19 declaration under Section 564(b)(1) of the Act, 21 U.S.C.section 360bbb-3(b)(1), unless the authorization is terminated  or revoked sooner.       Influenza A by PCR NEGATIVE NEGATIVE Final   Influenza B by PCR NEGATIVE NEGATIVE Final    Comment: (NOTE) The Xpert Xpress SARS-CoV-2/FLU/RSV plus assay is intended as an aid in the diagnosis of influenza from Nasopharyngeal swab specimens and should not be used as a sole basis for treatment. Nasal washings and aspirates are unacceptable for Xpert Xpress SARS-CoV-2/FLU/RSV testing.  Fact Sheet for Patients: BloggerCourse.com  Fact Sheet for Healthcare Providers: SeriousBroker.it  This test is not yet approved or cleared by the [place] FDA and has been authorized for detection and/or diagnosis of SARS-CoV-2 by FDA under an Emergency Use Authorization (EUA). This EUA will remain in effect (meaning this test can be used) for the duration of the COVID-19 declaration under Section 564(b)(1) of the Act, 21 U.S.C. section 360bbb-3(b)(1), unless the authorization is terminated or revoked.  Performed at [facility], [address] [place], [place] [address]   Urine Culture     Status: Abnormal (Preliminary result)   Collection Time: 11/27/20  7:41 PM   Specimen: PATH Cytology Urine  Result Value Ref Range Status   Specimen Description   Final    CYSTOSCOPY Performed  at [facility], [address], [place], [place] [address]    Special Requests   Final    LEFT RENAL PELVIS Performed at [facility], [address], [place], [place] [address]    Culture 10,000 COLONIES/mL GRAM NEGATIVE RODS (A)  Final   Report Status PENDING  Incomplete    Please note: You were cared for by a hospitalist during your hospital stay. Once you are discharged, your primary care physician  will handle any further medical issues. Please note that NO REFILLS for any discharge medications will be authorized once you are discharged, as it is imperative that you return to your primary care physician (or establish a relationship with a primary care physician if you do not have one) for your post hospital discharge needs so that they can reassess your need for medications and monitor your lab values.    Time coordinating discharge: 40 minutes  SIGNED:   Burnadette Pop, MD  [facility] 11/29/2020, 11:09 AM Pager [phone]  If 7PM-7AM, please contact night-coverage [email or web address] Password [email or web address]

## 2020-11-29 NOTE — Progress Notes (Signed)
Pt will leave this afternoon with her mother. Alert, oriented, and without c/o. Discharge instructions given/explained with pt verbalizing understanding.  Pt aware to pickup prescriptions and to followup with PCP. Pt leaving with work Physicist, medical.

## 2020-11-30 LAB — URINE CULTURE: Culture: 10000 — AB

## 2020-12-02 LAB — CULTURE, BLOOD (ROUTINE X 2)
Culture: NO GROWTH
Culture: NO GROWTH
Special Requests: ADEQUATE
Special Requests: ADEQUATE

## 2020-12-03 ENCOUNTER — Encounter: Payer: Self-pay | Admitting: Internal Medicine

## 2020-12-04 ENCOUNTER — Other Ambulatory Visit: Payer: BC Managed Care – PPO

## 2020-12-08 ENCOUNTER — Encounter: Admission: RE | Payer: Self-pay | Source: Home / Self Care

## 2020-12-08 ENCOUNTER — Ambulatory Visit
Admission: RE | Admit: 2020-12-08 | Payer: BC Managed Care – PPO | Source: Home / Self Care | Admitting: Internal Medicine

## 2020-12-08 SURGERY — ESOPHAGOGASTRODUODENOSCOPY (EGD) WITH PROPOFOL
Anesthesia: General

## 2020-12-08 NOTE — Anesthesia Postprocedure Evaluation (Signed)
Anesthesia Post Note  Patient: DALLYS NOWAKOWSKI  Procedure(s) Performed: CYSTOSCOPY WITH RETROGRADE PYELOGRAM/URETERAL STENT PLACEMENT (Left Bladder)     Patient location during evaluation: PACU Anesthesia Type: General Level of consciousness: awake and alert Pain management: pain level controlled Vital Signs Assessment: post-procedure vital signs reviewed and stable Respiratory status: spontaneous breathing, nonlabored ventilation, respiratory function stable and patient connected to nasal cannula oxygen Cardiovascular status: blood pressure returned to baseline and stable Postop Assessment: no apparent nausea or vomiting Anesthetic complications: no   No complications documented.  Last Vitals:  Vitals:   11/28/20 2130 11/29/20 0455  BP:  118/81  Pulse:  79  Resp:  14  Temp: 37.1 C 37 C  SpO2:  98%    Last Pain:  Vitals:   11/29/20 0455  TempSrc: Oral  PainSc:                  Willeen Novak S

## 2020-12-09 ENCOUNTER — Other Ambulatory Visit (HOSPITAL_COMMUNITY)
Admission: RE | Admit: 2020-12-09 | Discharge: 2020-12-09 | Disposition: A | Discharge status: Home / Self Care | Payer: BC Managed Care – PPO | Source: Ambulatory Visit | Attending: Urology | Admitting: Urology

## 2020-12-09 DIAGNOSIS — Z20822 Contact with and (suspected) exposure to covid-19: Secondary | ICD-10-CM | POA: Insufficient documentation

## 2020-12-09 DIAGNOSIS — Z91041 Radiographic dye allergy status: Secondary | ICD-10-CM | POA: Diagnosis not present

## 2020-12-09 DIAGNOSIS — Z888 Allergy status to other drugs, medicaments and biological substances status: Secondary | ICD-10-CM | POA: Diagnosis not present

## 2020-12-09 DIAGNOSIS — F17200 Nicotine dependence, unspecified, uncomplicated: Secondary | ICD-10-CM | POA: Diagnosis not present

## 2020-12-09 DIAGNOSIS — Z91040 Latex allergy status: Secondary | ICD-10-CM | POA: Diagnosis not present

## 2020-12-09 DIAGNOSIS — N201 Calculus of ureter: Secondary | ICD-10-CM | POA: Diagnosis not present

## 2020-12-09 DIAGNOSIS — Z01812 Encounter for preprocedural laboratory examination: Secondary | ICD-10-CM | POA: Insufficient documentation

## 2020-12-09 LAB — SARS CORONAVIRUS 2 (TAT 6-24 HRS): SARS Coronavirus 2: NEGATIVE

## 2020-12-10 NOTE — Progress Notes (Signed)
Pt. Needs orders for upcomming surgery.PAT and labs appointment on 12/11/20.Thanks.

## 2020-12-10 NOTE — Patient Instructions (Addendum)
DUE TO COVID-19 ONLY ONE VISITOR IS ALLOWED TO COME WITH YOU AND STAY IN THE WAITING ROOM ONLY DURING PRE OP AND PROCEDURE DAY OF SURGERY. THE 1 VISITOR  MAY VISIT WITH YOU AFTER SURGERY IN YOUR PRIVATE ROOM DURING VISITING HOURS ONLY!  YOU NEED TO HAVE A COVID 19 TEST ON: 12/10/20, THIS TEST MUST BE DONE BEFORE SURGERY,  COVID TESTING SITE [address] [place] [address], IT IS ON THE RIGHT GOING OUT [address] APPROXIMATELY  2 MINUTES PAST [employer] ON THE RIGHT. ONCE YOUR COVID TEST IS COMPLETED,  PLEASE BEGIN THE QUARANTINE INSTRUCTIONS AS OUTLINED IN YOUR HANDOUT.                Tonya Holt    Your procedure is scheduled on: 12/12/20   Report to [facility] Main  Entrance   Report to admitting at: 1:30 PM     Call this number if you have problems the morning of surgery [phone]    Remember: Do not eat solid food :After Midnight. Clear liquids until: 12:30 PM  CLEAR LIQUID DIET   Foods Allowed                                                                     Foods Excluded  Coffee and tea, regular and decaf                             liquids that you cannot  Plain Jell-O any favor except red or purple                                           see through such as: Fruit ices (not with fruit pulp)                                     milk, soups, orange juice  Iced Popsicles                                    All solid food Carbonated beverages, regular and diet                                    Cranberry, grape and apple juices Sports drinks like Gatorade Lightly seasoned clear broth or consume(fat free) Sugar, honey syrup  Sample Menu Breakfast                                Lunch                                     Supper Cranberry juice  Beef broth                            Chicken broth Jell-O                                     Grape juice                           Apple juice Coffee or tea                         Jell-O                                      Popsicle                                                Coffee or tea                        Coffee or tea   BRUSH YOUR TEETH MORNING OF SURGERY AND RINSE YOUR MOUTH OUT, NO CHEWING GUM CANDY OR MINTS.    Take these medicines the morning of surgery with A SIP OF WATER: propranolol,oxybutynin,tamsulosin.                         You may not have any metal on your body including hair pins and              piercings  Do not wear jewelry, make-up, lotions, powders or perfumes, deodorant             Do not wear nail polish on your fingernails.  Do not shave  48 hours prior to surgery.           Do not bring valuables to the hospital. [facility] IS NOT             RESPONSIBLE   FOR VALUABLES.  Contacts, dentures or bridgework may not be worn into surgery.  Leave suitcase in the car. After surgery it may be brought to your room.     Patients discharged the day of surgery will not be allowed to drive home. IF YOU ARE HAVING SURGERY AND GOING HOME THE SAME DAY, YOU MUST HAVE AN ADULT TO DRIVE YOU HOME AND BE WITH YOU FOR 24 HOURS. YOU MAY GO HOME BY TAXI OR UBER OR ORTHERWISE, BUT AN ADULT MUST ACCOMPANY YOU HOME AND STAY WITH YOU FOR 24 HOURS.  Name and phone number of your driver:  Special Instructions: N/A              Please read over the following fact sheets you were given: _____________________________________________________________________          [facility] - Preparing for Surgery Before surgery, you can play an important role.  Because skin is not sterile, your skin needs to be as free of germs as possible.  You can reduce the number of germs on your skin by washing with CHG (chlorahexidine gluconate) soap before surgery.  CHG is an antiseptic  cleaner which kills germs and bonds with the skin to continue killing germs even after washing. Please DO NOT use if you have an allergy to CHG or antibacterial soaps.  If your skin becomes  reddened/irritated stop using the CHG and inform your nurse when you arrive at Short Stay. Do not shave (including legs and underarms) for at least 48 hours prior to the first CHG shower.  You may shave your face/neck. Please follow these instructions carefully:  1.  Shower with CHG Soap the night before surgery and the  morning of Surgery.  2.  If you choose to wash your hair, wash your hair first as usual with your  normal  shampoo.  3.  After you shampoo, rinse your hair and body thoroughly to remove the  shampoo.                           4.  Use CHG as you would any other liquid soap.  You can apply chg directly  to the skin and wash                       Gently with a scrungie or clean washcloth.  5.  Apply the CHG Soap to your body ONLY FROM THE NECK DOWN.   Do not use on face/ open                           Wound or open sores. Avoid contact with eyes, ears mouth and genitals (private parts).                       Wash face,  Genitals (private parts) with your normal soap.             6.  Wash thoroughly, paying special attention to the area where your surgery  will be performed.  7.  Thoroughly rinse your body with warm water from the neck down.  8.  DO NOT shower/wash with your normal soap after using and rinsing off  the CHG Soap.                9.  Pat yourself dry with a clean towel.            10.  Wear clean pajamas.            11.  Place clean sheets on your bed the night of your first shower and do not  sleep with pets. Day of Surgery : Do not apply any lotions/deodorants the morning of surgery.  Please wear clean clothes to the hospital/surgery center.  FAILURE TO FOLLOW THESE INSTRUCTIONS MAY RESULT IN THE CANCELLATION OF YOUR SURGERY PATIENT SIGNATURE_________________________________  NURSE SIGNATURE__________________________________  ________________________________________________________________________

## 2020-12-11 ENCOUNTER — Other Ambulatory Visit: Payer: Self-pay

## 2020-12-11 ENCOUNTER — Encounter (HOSPITAL_COMMUNITY)
Admission: RE | Admit: 2020-12-11 | Discharge: 2020-12-11 | Disposition: A | Discharge status: Home / Self Care | Payer: BC Managed Care – PPO | Source: Ambulatory Visit | Attending: Urology | Admitting: Urology

## 2020-12-11 ENCOUNTER — Encounter (HOSPITAL_COMMUNITY): Payer: Self-pay

## 2020-12-11 DIAGNOSIS — N201 Calculus of ureter: Secondary | ICD-10-CM | POA: Diagnosis not present

## 2020-12-11 DIAGNOSIS — Z01818 Encounter for other preprocedural examination: Secondary | ICD-10-CM | POA: Insufficient documentation

## 2020-12-11 HISTORY — DX: Essential (primary) hypertension: I10

## 2020-12-11 HISTORY — DX: Anemia, unspecified: D64.9

## 2020-12-11 HISTORY — DX: Other specified postprocedural states: Z98.890

## 2020-12-11 HISTORY — DX: Unspecified asthma, uncomplicated: J45.909

## 2020-12-11 HISTORY — DX: Other complications of anesthesia, initial encounter: T88.59XA

## 2020-12-11 HISTORY — DX: Depression, unspecified: F32.A

## 2020-12-11 LAB — CBC
HCT: 34.5 % — ABNORMAL LOW (ref 36.0–46.0)
Hemoglobin: 11.9 g/dL — ABNORMAL LOW (ref 12.0–15.0)
MCH: 37.2 pg — ABNORMAL HIGH (ref 26.0–34.0)
MCHC: 34.5 g/dL (ref 30.0–36.0)
MCV: 107.8 fL — ABNORMAL HIGH (ref 80.0–100.0)
Platelets: 371 10*3/uL (ref 150–400)
RBC: 3.2 MIL/uL — ABNORMAL LOW (ref 3.87–5.11)
RDW: 12.4 % (ref 11.5–15.5)
WBC: 6.6 10*3/uL (ref 4.0–10.5)
nRBC: 0 % (ref 0.0–0.2)

## 2020-12-11 LAB — BASIC METABOLIC PANEL
Anion gap: 8 (ref 5–15)
BUN: 5 mg/dL — ABNORMAL LOW (ref 6–20)
CO2: 29 mmol/L (ref 22–32)
Calcium: 8.8 mg/dL — ABNORMAL LOW (ref 8.9–10.3)
Chloride: 105 mmol/L (ref 98–111)
Creatinine, Ser: 0.57 mg/dL (ref 0.44–1.00)
GFR, Estimated: >60 mL/min (ref >60)
Glucose, Bld: 102 mg/dL — ABNORMAL HIGH (ref 70–99)
Potassium: 4.5 mmol/L (ref 3.5–5.1)
Sodium: 142 mmol/L (ref 135–145)

## 2020-12-11 NOTE — Progress Notes (Signed)
COVID Vaccine Completed: NO Date COVID Vaccine completed: COVID vaccine manufacturer: [employer]   PCP - Dr. Aram Beecham. Cardiologist -   Chest x-ray -  EKG - 09/09/20 requested Stress Test -  ECHO -  Cardiac Cath -  Pacemaker/ICD device last checked:  Sleep Study -  CPAP -   Fasting Blood Sugar -  Checks Blood Sugar _____ times a day  Blood Thinner Instructions: Aspirin Instructions: Last Dose:  Anesthesia review:   Patient denies shortness of breath, fever, cough and chest pain at PAT appointment   Patient verbalized understanding of instructions that were given to them at the PAT appointment. Patient was also instructed that they will need to review over the PAT instructions again at home before surgery.

## 2020-12-12 ENCOUNTER — Ambulatory Visit (HOSPITAL_COMMUNITY): Payer: BC Managed Care – PPO | Admitting: Anesthesiology

## 2020-12-12 ENCOUNTER — Encounter (HOSPITAL_COMMUNITY)
Admission: RE | Disposition: A | Discharge status: Home / Self Care | Payer: Self-pay | Source: Home / Self Care | Attending: Urology

## 2020-12-12 ENCOUNTER — Ambulatory Visit (HOSPITAL_COMMUNITY)
Admission: RE | Admit: 2020-12-12 | Discharge: 2020-12-12 | Disposition: A | Discharge status: Home / Self Care | Payer: BC Managed Care – PPO | Attending: Urology | Admitting: Urology

## 2020-12-12 ENCOUNTER — Encounter (HOSPITAL_COMMUNITY): Payer: Self-pay | Admitting: Urology

## 2020-12-12 ENCOUNTER — Ambulatory Visit (HOSPITAL_COMMUNITY): Payer: BC Managed Care – PPO

## 2020-12-12 DIAGNOSIS — N201 Calculus of ureter: Secondary | ICD-10-CM | POA: Insufficient documentation

## 2020-12-12 DIAGNOSIS — F17200 Nicotine dependence, unspecified, uncomplicated: Secondary | ICD-10-CM | POA: Insufficient documentation

## 2020-12-12 DIAGNOSIS — Z20822 Contact with and (suspected) exposure to covid-19: Secondary | ICD-10-CM | POA: Insufficient documentation

## 2020-12-12 DIAGNOSIS — Z91040 Latex allergy status: Secondary | ICD-10-CM | POA: Insufficient documentation

## 2020-12-12 DIAGNOSIS — Z91041 Radiographic dye allergy status: Secondary | ICD-10-CM | POA: Insufficient documentation

## 2020-12-12 DIAGNOSIS — Z888 Allergy status to other drugs, medicaments and biological substances status: Secondary | ICD-10-CM | POA: Insufficient documentation

## 2020-12-12 HISTORY — PX: CYSTOSCOPY/URETEROSCOPY/HOLMIUM LASER/STENT PLACEMENT: SHX6546

## 2020-12-12 LAB — PREGNANCY, URINE: Preg Test, Ur: NEGATIVE

## 2020-12-12 SURGERY — CYSTOSCOPY/URETEROSCOPY/HOLMIUM LASER/STENT PLACEMENT
Anesthesia: General | Laterality: Left

## 2020-12-12 MED ORDER — IOHEXOL 300 MG/ML  SOLN
INTRAMUSCULAR | Status: DC | PRN
  Administered 2020-12-12: 16:00:00 3 mL

## 2020-12-12 MED ORDER — SODIUM CHLORIDE 0.9 % IR SOLN
Status: DC | PRN
  Administered 2020-12-12 (×2): 3000 mL

## 2020-12-12 MED ORDER — DOCUSATE SODIUM 100 MG PO CAPS: 100.0000 mg | ORAL_CAPSULE | Freq: Every day | ORAL | 0 refills | Status: DC | PRN

## 2020-12-12 MED ORDER — ONDANSETRON HCL 4 MG/2ML IJ SOLN
INTRAMUSCULAR | Status: DC | PRN
  Administered 2020-12-12: 4 mg via INTRAVENOUS

## 2020-12-12 MED ORDER — FENTANYL CITRATE (PF) 100 MCG/2ML IJ SOLN
INTRAMUSCULAR | Status: AC
  Filled 2020-12-12: qty 2

## 2020-12-12 MED ORDER — PHENYLEPHRINE 40 MCG/ML (10ML) SYRINGE FOR IV PUSH (FOR BLOOD PRESSURE SUPPORT)
PREFILLED_SYRINGE | INTRAVENOUS | Status: DC | PRN
  Administered 2020-12-12: 160 ug via INTRAVENOUS

## 2020-12-12 MED ORDER — LACTATED RINGERS IV SOLN: INTRAVENOUS | Status: DC

## 2020-12-12 MED ORDER — DEXAMETHASONE SODIUM PHOSPHATE 4 MG/ML IJ SOLN
INTRAMUSCULAR | Status: DC | PRN
  Administered 2020-12-12: 8 mg via INTRAVENOUS

## 2020-12-12 MED ORDER — CEPHALEXIN 500 MG PO CAPS: 500.0000 mg | ORAL_CAPSULE | Freq: Four times a day (QID) | ORAL | 0 refills | Status: AC

## 2020-12-12 MED ORDER — FENTANYL CITRATE (PF) 100 MCG/2ML IJ SOLN
INTRAMUSCULAR | Status: DC | PRN
  Administered 2020-12-12: 50 ug via INTRAVENOUS
  Administered 2020-12-12 (×2): 25 ug via INTRAVENOUS

## 2020-12-12 MED ORDER — LIDOCAINE HCL (CARDIAC) PF 100 MG/5ML IV SOSY
PREFILLED_SYRINGE | INTRAVENOUS | Status: DC | PRN
  Administered 2020-12-12: 60 mg via INTRAVENOUS

## 2020-12-12 MED ORDER — OXYCODONE-ACETAMINOPHEN 5-325 MG PO TABS: 1.0000 | ORAL_TABLET | ORAL | 0 refills | Status: DC | PRN

## 2020-12-12 MED ORDER — PROPOFOL 10 MG/ML IV BOLUS
INTRAVENOUS | Status: DC | PRN
  Administered 2020-12-12: 200 mg via INTRAVENOUS

## 2020-12-12 MED ORDER — SCOPOLAMINE 1 MG/3DAYS TD PT72
1.0000 | MEDICATED_PATCH | TRANSDERMAL | Status: DC
  Administered 2020-12-12: 15:00:00 1.5 mg via TRANSDERMAL
  Filled 2020-12-12: qty 1

## 2020-12-12 MED ORDER — FENTANYL CITRATE (PF) 100 MCG/2ML IJ SOLN
25.0000 ug | INTRAMUSCULAR | Status: DC | PRN
  Administered 2020-12-12: 25 ug via INTRAVENOUS

## 2020-12-12 MED ORDER — ACETAMINOPHEN 500 MG PO TABS
1000.0000 mg | ORAL_TABLET | Freq: Once | ORAL | Status: AC
  Administered 2020-12-12: 15:00:00 1000 mg via ORAL
  Filled 2020-12-12: qty 2

## 2020-12-12 MED ORDER — PROPOFOL 10 MG/ML IV BOLUS
INTRAVENOUS | Status: AC
  Filled 2020-12-12: qty 20

## 2020-12-12 MED ORDER — CELECOXIB 200 MG PO CAPS
200.0000 mg | ORAL_CAPSULE | Freq: Once | ORAL | Status: AC
  Administered 2020-12-12: 15:00:00 200 mg via ORAL
  Filled 2020-12-12: qty 1

## 2020-12-12 MED ORDER — ORAL CARE MOUTH RINSE: 15.0000 mL | Freq: Once | OROMUCOSAL | Status: AC

## 2020-12-12 MED ORDER — PROMETHAZINE HCL 25 MG/ML IJ SOLN: 6.2500 mg | INTRAMUSCULAR | Status: DC | PRN

## 2020-12-12 MED ORDER — MIDAZOLAM HCL 2 MG/2ML IJ SOLN
INTRAMUSCULAR | Status: AC
  Filled 2020-12-12: qty 2

## 2020-12-12 MED ORDER — MIDAZOLAM HCL 5 MG/5ML IJ SOLN
INTRAMUSCULAR | Status: DC | PRN
  Administered 2020-12-12: 2 mg via INTRAVENOUS

## 2020-12-12 MED ORDER — CHLORHEXIDINE GLUCONATE 0.12 % MT SOLN
15.0000 mL | Freq: Once | OROMUCOSAL | Status: AC
  Administered 2020-12-12: 15:00:00 15 mL via OROMUCOSAL

## 2020-12-12 SURGICAL SUPPLY — 22 items
BAG URO CATCHER STRL LF (MISCELLANEOUS) ×3 IMPLANT
BASKET ZERO TIP NITINOL 2.4FR (BASKET) ×3 IMPLANT
CATH URET 5FR 28IN OPEN ENDED (CATHETERS) ×3 IMPLANT
CLOTH BEACON ORANGE TIMEOUT ST (SAFETY) ×3 IMPLANT
FIBER LASER MOSES 200 DFL (Laser) IMPLANT
GLOVE BIOGEL M 7.0 STRL (GLOVE) ×3 IMPLANT
GOWN STRL REUS W/TWL LRG LVL3 (GOWN DISPOSABLE) ×3 IMPLANT
GUIDEWIRE STR DUAL SENSOR (WIRE) ×6 IMPLANT
GUIDEWIRE ZIPWRE .038 STRAIGHT (WIRE) IMPLANT
IV NS 1000ML (IV SOLUTION) ×2
IV NS 1000ML BAXH (IV SOLUTION) ×1 IMPLANT
KIT TURNOVER KIT A (KITS) IMPLANT
LASER FIB FLEXIVA PULSE ID 365 (Laser) IMPLANT
MANIFOLD NEPTUNE II (INSTRUMENTS) ×3 IMPLANT
PACK CYSTO (CUSTOM PROCEDURE TRAY) ×3 IMPLANT
SHEATH URETERAL 12FRX35CM (MISCELLANEOUS) IMPLANT
STENT URET 6FRX24 CONTOUR (STENTS) ×3 IMPLANT
TRACTIP FLEXIVA PULS ID 200XHI (Laser) IMPLANT
TRACTIP FLEXIVA PULSE ID 200 (Laser)
TUBING CONNECTING 10 (TUBING) ×2 IMPLANT
TUBING CONNECTING 10' (TUBING) ×1
TUBING UROLOGY SET (TUBING) ×3 IMPLANT

## 2020-12-12 NOTE — Discharge Instructions (Signed)
   Activity:  You are encouraged to ambulate frequently (about every hour during waking hours) to help prevent blood clots from forming in your legs or lungs.  However, you should not engage in any heavy lifting (> 10-15 lbs), strenuous activity, or straining.   Diet: You should advance your diet as instructed by your physician.  It will be normal to have some bloating, nausea, and abdominal discomfort intermittently.   Prescriptions:  You will be provided a prescription for pain medication to take as needed.  If your pain is not severe enough to require the prescription pain medication, you may take extra strength Tylenol instead which will have less side effects.  You should also take a prescribed stool softener to avoid straining with bowel movements as the prescription pain medication may constipate you.   What to call us about: You should call the office [phone]) if you develop fever > 101 or develop persistent vomiting. Activity:  You are encouraged to ambulate frequently (about every hour during waking hours) to help prevent blood clots from forming in your legs or lungs.  However, you should not engage in any heavy lifting (> 10-15 lbs), strenuous activity, or straining.  You have a right ureteral stent draining your kidney. Remove this on Monday AM by pulling on attached string.

## 2020-12-12 NOTE — Transfer of Care (Signed)
Immediate Anesthesia Transfer of Care Note  Patient: Tonya Holt  Procedure(s) Performed: Procedure(s) with comments: CYSTOSCOPY/RETROGRADE/URETEROSCOPY/STENT PLACEMENT (Left) - ONLY NEEDS 30 MIN  Patient Location: PACU  Anesthesia Type:General  Level of Consciousness: Alert, Awake, Oriented  Airway & Oxygen Therapy: Patient Spontanous Breathing  Post-op Assessment: Report given to RN  Post vital signs: Reviewed and stable  Last Vitals:  Vitals:   12/12/20 1411  BP: 122/89  Pulse: 86  Resp: 15  Temp: 37.4 C  SpO2: 100%    Complications: No apparent anesthesia complications

## 2020-12-12 NOTE — Anesthesia Preprocedure Evaluation (Addendum)
Anesthesia Evaluation  Patient identified by MRN, date of birth, ID band Patient awake    Reviewed: Allergy & Precautions, NPO status , Patient's Chart, lab work & pertinent test results  History of Anesthesia Complications Negative for: history of anesthetic complications  Airway Mallampati: II  TM Distance: >3 FB Neck ROM: Full    Dental no notable dental hx. (+) Dental Advisory Given   Pulmonary neg pulmonary ROS, Current Smoker and Patient abstained from smoking., former smoker,    Pulmonary exam normal        Cardiovascular hypertension, Pt. on home beta blockers Normal cardiovascular exam     Neuro/Psych Anxiety negative neurological ROS     GI/Hepatic negative GI ROS, Neg liver ROS,   Endo/Other  negative endocrine ROS  Renal/GU negative Renal ROS  negative genitourinary   Musculoskeletal negative musculoskeletal ROS (+)   Abdominal   Peds negative pediatric ROS (+)  Hematology negative hematology ROS (+)   Anesthesia Other Findings   Reproductive/Obstetrics negative OB ROS                           Anesthesia Physical  Anesthesia Plan  ASA: II  Anesthesia Plan: General   Post-op Pain Management:    Induction: Intravenous  PONV Risk Score and Plan: 3 and Ondansetron, Dexamethasone, Treatment may vary due to age or medical condition and Midazolam  Airway Management Planned: LMA  Additional Equipment:   Intra-op Plan:   Post-operative Plan: Extubation in OR  Informed Consent: I have reviewed the patients History and Physical, chart, labs and discussed the procedure including the risks, benefits and alternatives for the proposed anesthesia with the patient or authorized representative who has indicated his/her understanding and acceptance.     Dental advisory given  Plan Discussed with: CRNA and Anesthesiologist  Anesthesia Plan Comments:        Anesthesia  Quick Evaluation

## 2020-12-12 NOTE — H&P (Signed)
Office Visit Report     12/09/2020   --------------------------------------------------------------------------------   Tonya Holt. Sobieski  MRN: [id number]  DOB: 20-Jun-1991, 29 year old Female  SSN:    PRIMARY CARE:    REFERRING:    PROVIDER:  Jettie Holt, M.D.  LOCATION:  [facility] - [phone]     --------------------------------------------------------------------------------   CC/HPI: Tonya Holt is a 28 year old female seen in consultation for a left ureteral stone.   She presented to the ED on 11/27/2020 febrile to 102.6, associated with nausea, emesis and left upper abdominal pain. CT A/P 11/27/2020 revealed 3 mm proximal left ureteral stone associate with moderate left hydronephrosis. There are no other nephrolithiasis identified. WBC 7.5, creatinine 0.86, urinalysis with negative nitrite, large leukocyte esterase, few bacteria.   She does have a history of transaminitis and dyspepsia. 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   She underwent cystoscopy, left stent placement on 11/27/2020. Since then, she has reported some stent discomfort. She denies fevers, chills, dysuria. She does have sensation of urgency.     ALLERGIES: None   MEDICATIONS: Ditropan Xl 10 mg tablet, extended release 24 hr 1 tablet PO Daily  Tamsulosin Hcl 0.4 mg capsule 1 capsule PO Daily     GU PSH: Cystoscopy Insert Stent, Left - 11/27/2020     NON-GU PSH: None   GU PMH: None   NON-GU PMH: None   FAMILY HISTORY: None   SOCIAL HISTORY: None   REVIEW OF SYSTEMS:    GU Review Female:   Patient denies frequent urination, hard to postpone urination, burning /pain with urination, get up at night to urinate, leakage of urine, stream starts and stops, trouble starting your stream, have to strain to urinate, and being pregnant.  Gastrointestinal (Upper):   Patient denies nausea, vomiting, and indigestion/ heartburn.   Gastrointestinal (Lower):   Patient denies diarrhea and constipation.  Constitutional:   Patient denies fever, night sweats, weight loss, and fatigue.  Skin:   Patient denies skin rash/ lesion and itching.  Eyes:   Patient denies blurred vision and double vision.  Ears/ Nose/ Throat:   Patient denies sore throat and sinus problems.  Hematologic/Lymphatic:   Patient denies swollen glands and easy bruising.  Cardiovascular:   Patient denies leg swelling and chest pains.  Respiratory:   Patient denies cough and shortness of breath.  Endocrine:   Patient denies excessive thirst.  Musculoskeletal:   Patient denies back pain and joint pain.  Neurological:   Patient denies headaches and dizziness.  Psychologic:   Patient denies depression and anxiety.   VITAL SIGNS:      12/09/2020 01:46 PM  Weight 142 lb / 64.41 kg  Height 63 in / 160.02 cm  BP 113/76 mmHg  Pulse 89 /min  Temperature 97.7 F / 36.5 C  BMI 25.2 kg/m   MULTI-SYSTEM PHYSICAL EXAMINATION:    Constitutional: Well-nourished. No physical deformities. Normally developed. Good grooming.  Respiratory: No labored breathing, no use of accessory muscles.   Cardiovascular: Normal temperature, normal extremity pulses, no swelling, no varicosities.  Gastrointestinal: No mass, no tenderness, no rigidity, non obese abdomen.     Complexity of Data:  Source Of History:  Patient, Medical Record Summary  Urine Test Review:   Urinalysis  X-Ray Review: C.T. Abdomen: Reviewed Films. Reviewed Report.     PROCEDURES:          Urinalysis w/Scope Dipstick Dipstick Cont'd Micro  Color: Yellow  Bilirubin: Neg mg/dL WBC/hpf: 0 - 5/hpf  Appearance: Cloudy Ketones: Neg mg/dL RBC/hpf: >16/XWR>60/hpf  Specific Gravity: 1.020 Blood: 3+ ery/uL Bacteria: Few (10-25/hpf)  pH: 5.5 Protein: 3+ mg/dL Cystals: NS (Not Seen)  Glucose: Neg mg/dL Urobilinogen: 0.2 mg/dL Casts: NS (Not Seen)    Nitrites: Neg Trichomonas: Not Present    Leukocyte Esterase: Neg  leu/uL Mucous: Present      Epithelial Cells: 0 - 5/hpf      Yeast: NS (Not Seen)      Sperm: Not Present    ASSESSMENT:      ICD-10 Details  1 GU:   Ureteral calculus - N20.1    PLAN:           Orders Labs Urinalysis, CULTURE, URINE          Schedule Return Visit/Planned Activity: ASAP - Schedule Surgery          Document Letter(s):  Created for Patient: Clinical Summary         Notes:   1. Left ureteral stone: CT A/P 11/27/2020 with 3 mm proximal left ureteral stone associated with sepsis. S/p left ureteral stent placement on 11/27/2020. Obtain urine for culture today. As long as urine remains negative, we will proceed with cystoscopy, left retrograde pyelogram, left ureteroscopy with laser lithotripsy of stone, stent exchange this Friday. Discussed all risks and benefits as below.   We discussed the options for management of ureteral stones, including trial of passage with medical expulsive therapy, ESWL, and ureteroscopy with laser lithotripsy. Smaller and more distal stones are best managed with trial of passage with medical expulsive therapy. Larger and more proximal stones are less likely to pass spontaneously, and are therefore best managed with either ESWL or ureteroscopy, with ureteroscopy offering higher stone free rates. The risks and benefits of each option were discussed.   Trial of passage: oral analgesics and medical expulsive therapy with an alpha blocker will be provided to assist with stone passage. The patient is instructed to return or go to ER for emergent intervention if intolerable pain, intractable nausea/vomiting, or fever develops.   ESWL: risks and benefits of ESWL were outlined including infection, bleeding, pain, steinstrasse, kidney injury, need for ancillary treatments, and global anesthesia risks including but not limited to CVA, MI, DVT, PE, pneumonia, and death.   Ureteroscopy: risks and benefits of ureteroscopy were outlined, including infection,  bleeding, pain, temporary ureteral stent and associated stent bother, ureteral injury, ureteral stricture, need for ancillary treatments, and global anesthesia risks including but not limited to CVA, MI, DVT, PE, pneumonia, and death.            Next Appointment:      Next Appointment: 12/12/2020 03:30 PM    Appointment Type: Surgery     Location: [facility] [phone]    Provider: Jettie PaganMatthew Larren Copes, M.D.    Reason for Visit: [facility]/OP CYSTO LT URS LL, LT RPG, LT STENT EXCHANGE      Signed by Tonya PaganMatthew Mychael Smock, M.D. on 12/09/20 at 2:31 PM (EST)  Urology Preoperative H&P   Chief Complaint: Left ureteral stone  History of Present Illness: Tonya Holt is a 29 y.o. female with left ureteral stone here for cysto, L URS/LL, stent exchange.    Past Medical History:  Diagnosis Date  . ADHD (attention deficit hyperactivity disorder) 2001  . Anemia   . Anxiety   . Asthma   . Complication of anesthesia   . Depression   . Dysmenorrhea treated with oral  contraceptive 08/14/2014  . History of kidney stones   . Hormone disorder   . Hypertension   . PONV (postoperative nausea and vomiting)     Past Surgical History:  Procedure Laterality Date  . CYSTOSCOPY W/ URETERAL STENT PLACEMENT Left 11/27/2020   Procedure: CYSTOSCOPY WITH RETROGRADE PYELOGRAM/URETERAL STENT PLACEMENT;  Surgeon: Jannifer Hick, MD;  Location: [facility];  Service: Urology;  Laterality: Left;  . LAPAROSCOPIC OVARIAN CYSTECTOMY Left 2010   follicular cyst, no endometriosis  . MENISCUS REPAIR  12/2012  . TONSILLECTOMY  Age 11    Allergies:  Allergies  Allergen Reactions  . Clonazepam Other (See Comments)    Makes her forget what she did the day before.  . Escitalopram Other (See Comments)    Made her moody and feel crazy  . Iodinated Diagnostic Agents Other (See Comments)    "makes her crazy"   . Latex Hives    Family History  Problem Relation Age of Onset  . Ovarian cancer Other        Maternal  Great Grandmother  . Breast cancer Paternal Grandmother 69  . Diabetes Father   . Hyperthyroidism Father   . Diabetes Paternal Grandfather   . Diabetes Paternal Uncle   . Endometriosis Mother   . Hypertension Mother     Social History:  reports that she has been smoking. She has never used smokeless tobacco. She reports previous alcohol use of about 2.0 standard drinks of alcohol per week. She reports that she does not use drugs.  ROS: A complete review of systems was performed.  All systems are negative except for pertinent findings as noted.  Physical Exam:  Vital signs in last 24 hours:   Constitutional:  Alert and oriented, No acute distress Cardiovascular: Regular rate and rhythm Respiratory: Normal respiratory effort, Lungs clear bilaterally GI: Abdomen is soft, nontender, nondistended, no abdominal masses GU: No CVA tenderness Lymphatic: No lymphadenopathy Neurologic: Grossly intact, no focal deficits Psychiatric: Normal mood and affect  Laboratory Data:  Recent Labs    12/11/20 0839  WBC 6.6  HGB 11.9*  HCT 34.5*  PLT 371    Recent Labs    12/11/20 0839  NA 142  K 4.5  CL 105  GLUCOSE 102*  BUN 5*  CALCIUM 8.8*  CREATININE 0.57     No results found for this or any previous visit (from the past 24 hour(s)). Recent Results (from the past 240 hour(s))  SARS CORONAVIRUS 2 (TAT 6-24 HRS) Nasopharyngeal Nasopharyngeal Swab     Status: None   Collection Time: 12/09/20  2:33 PM   Specimen: Nasopharyngeal Swab  Result Value Ref Range Status   SARS Coronavirus 2 NEGATIVE NEGATIVE Final    Comment: (NOTE) SARS-CoV-2 target nucleic acids are NOT DETECTED.  The SARS-CoV-2 RNA is generally detectable in upper and lower respiratory specimens during the acute phase of infection. Negative results do not preclude SARS-CoV-2 infection, do not rule out co-infections with other pathogens, and should not be used as the sole basis for treatment or other patient  management decisions. Negative results must be combined with clinical observations, patient history, and epidemiological information. The expected result is Negative.  Fact Sheet for Patients: HairSlick.no  Fact Sheet for Healthcare Providers: quierodirigir.com  This test is not yet approved or cleared by the [place] FDA and  has been authorized for detection and/or diagnosis of SARS-CoV-2 by FDA under an Emergency Use Authorization (EUA). This EUA will remain  in effect (meaning this test  can be used) for the duration of the COVID-19 declaration under Se ction 564(b)(1) of the Act, 21 U.S.C. section 360bbb-3(b)(1), unless the authorization is terminated or revoked sooner.  Performed at [facility], [address] [place], [place] [address]     Renal Function: Recent Labs    12/11/20 6314  CREATININE 0.57   Estimated Creatinine Clearance: 95.7 mL/min (by C-G formula based on SCr of 0.57 mg/dL).  Radiologic Imaging: No results found.  I independently reviewed the above imaging studies.  Assessment and Plan Tonya Holt is a 29 y.o. female with  left ureteral stone here for cysto, L URS/LL, stent exchange. UCx 12/14 NG. Ok to proceed.   Matt R. Mahkai Fangman MD 12/12/2020, 2:03 PM  [facility] Pager: [phone]): [phone]

## 2020-12-12 NOTE — Anesthesia Procedure Notes (Signed)
Procedure Name: LMA Insertion Performed by: Sudie Grumbling, CRNA Pre-anesthesia Checklist: Patient identified, Emergency Drugs available, Suction available and Patient being monitored Patient Re-evaluated:Patient Re-evaluated prior to induction Oxygen Delivery Method: Circle system utilized Preoxygenation: Pre-oxygenation with 100% oxygen Induction Type: IV induction LMA: LMA with gastric port inserted Laryngoscope Size: 3 Tube secured with: Tape

## 2020-12-12 NOTE — Op Note (Signed)
Operative Note  Preoperative diagnosis:  1.  Left ureteral stone  Postoperative diagnosis: 1.  Left ureteral stone  Procedure(s): 1.  Cystoscopy 2.  Left ureteroscopy with basket traction of stone 3.  Left retrograde pyelogram 4.  Left ureteral stent exchange 5.  Fluoroscopy with intraoperative interpretation  Surgeon: Jettie Pagan, MD  Assistants:  None  Anesthesia:  General  Complications:  None  EBL:  minimal  Specimens: 1. Stones for stone analysis  Drains/Catheters: 1.  6 French by 24 cm left ureteral stent  Intraoperative findings:   1. 3 cm left proximal ureteral stone  Indication:  Tonya Holt is a 29 y.o. female with an obstructing 3 mm left proximal ureteral stone with signs of sepsis.  She underwent cystoscopy, left ureteral stent placement on 11/27/2020.  Her infection resolved.  She had a negative urine culture on 12/09/2020.  She presents today for definitive treatment of her left ureteral stone.  Description of procedure: After informed consent was obtained from the patient, the patient was identified and taken to the operating room and placed in the supine position.  General anesthesia was administered as well as perioperative IV antibiotics.  At the beginning of the case, a time-out was performed to properly identify the patient, the surgery to be performed, and the surgical site.  Sequential compression devices were applied to the lower extremities at the beginning of the case for DVT prophylaxis.  The patient was then placed in the dorsal lithotomy supine position, prepped and draped in sterile fashion.  We then passed the 21-French rigid cystoscope through the urethra and into the bladder under vision without any difficulty, noting a normal urethra without strictures. A systematic evaluation of the bladder revealed no evidence of any suspicious bladder lesions.  Ureteral orifices were in normal position. There was evidence of inflammation in her bladder  from prior stent.  The distal aspect of the ureteral stent was seen protruding from the left ureteral orifice.  We then used the alligator-tooth forceps and grasped the distal end of the ureteral stent and brought it out the urethral meatus while watching the proximal coil straighten out nicely on fluoroscopy. Through the ureteral stent, we then passed a 0.038 sensor wire up to the level of the renal pelvis.  The ureteral stent was then removed, leaving the sensor wire up the left ureter.    A semi-rigid ureteroscope was passed alongside the wire up the distal ureter which appeared normal. A 2.2 Fr zero tip basket was used to remove the stone under visual guidance. As she was pre-stented, there was no difficulty retrieving the stone. These were sent for chemical analysis. We then repassed the semirigid scope in the ureter and passed a separate zip wire. Over the zip wire, we passed a flexible digital ureteroscope and passed this in the kidney. With the ureteroscope in the kidney, a gentle pyelogram was performed to delineate the calyceal system and we evaluated the calyces systematically. We encountered no further stones. The calyces were re-inspected and there were no significant stone fragment residual.   We then withdrew the ureteroscope back down the ureter noting no evidence of any stones along the course of the ureter. There was no trauma to the ureter. Once the ureteroscope was removed, we then used the Glidewire under fluoroscopic guidance and passed up a 6-French x 24 cm double-pigtail ureteral stent up the ureter, making sure that the proximal and distal ends coiled within the kidney and bladder respectively.  Note that we  left a long tether string attached to the distal end of the ureteral stent and it exited the urethral meatus and was secured to the inner thigh with a tegaderm adhesive.  The cystoscope was then advanced back into the bladder under vision.  We were able to see the distal stent  coiling nicely within the bladder.  The bladder was then emptied with irrigation solution.  The cystoscope was then removed.    The patient tolerated the procedure well and there was no complication. Patient was awoken from anesthesia and taken to the recovery room in stable condition. I was present and scrubbed for the entirety of the case.  Plan:  Patient will be discharged home. She will remove her stent on Monday AM.   Tonya R. Mycala Warshawsky MD [facility]  Pager: [phone]

## 2020-12-12 NOTE — Anesthesia Postprocedure Evaluation (Signed)
Anesthesia Post Note  Patient: Tonya Holt  Procedure(s) Performed: CYSTOSCOPY/RETROGRADE/URETEROSCOPY/STENT PLACEMENT (Left )     Patient location during evaluation: PACU Anesthesia Type: General Level of consciousness: awake and alert Pain management: pain level controlled Vital Signs Assessment: post-procedure vital signs reviewed and stable Respiratory status: spontaneous breathing, nonlabored ventilation and respiratory function stable Cardiovascular status: blood pressure returned to baseline and stable Postop Assessment: no apparent nausea or vomiting Anesthetic complications: no   No complications documented.  Last Vitals:  Vitals:   12/12/20 1645 12/12/20 1650  BP: 116/72 119/71  Pulse: 79 75  Resp: 12   Temp: 36.9 C 36.5 C  SpO2:  100%    Last Pain:  Vitals:   12/12/20 1650  TempSrc: Oral  PainSc:                  Lucretia Kern

## 2020-12-13 ENCOUNTER — Encounter (HOSPITAL_COMMUNITY): Payer: Self-pay | Admitting: Urology

## 2020-12-16 ENCOUNTER — Other Ambulatory Visit: Payer: Self-pay

## 2020-12-16 ENCOUNTER — Telehealth: Payer: BC Managed Care – PPO | Admitting: Licensed Clinical Social Worker

## 2020-12-30 ENCOUNTER — Encounter: Payer: Self-pay | Admitting: Psychiatry

## 2020-12-30 ENCOUNTER — Other Ambulatory Visit: Payer: Self-pay

## 2020-12-30 ENCOUNTER — Telehealth (INDEPENDENT_AMBULATORY_CARE_PROVIDER_SITE_OTHER): Payer: BC Managed Care – PPO | Admitting: Psychiatry

## 2020-12-30 DIAGNOSIS — Z8659 Personal history of other mental and behavioral disorders: Secondary | ICD-10-CM | POA: Insufficient documentation

## 2020-12-30 DIAGNOSIS — F1994 Other psychoactive substance use, unspecified with psychoactive substance-induced mood disorder: Secondary | ICD-10-CM | POA: Diagnosis not present

## 2020-12-30 DIAGNOSIS — F1020 Alcohol dependence, uncomplicated: Secondary | ICD-10-CM

## 2020-12-30 DIAGNOSIS — F32A Depression, unspecified: Secondary | ICD-10-CM | POA: Insufficient documentation

## 2020-12-30 DIAGNOSIS — F419 Anxiety disorder, unspecified: Secondary | ICD-10-CM | POA: Insufficient documentation

## 2020-12-30 DIAGNOSIS — F909 Attention-deficit hyperactivity disorder, unspecified type: Secondary | ICD-10-CM | POA: Insufficient documentation

## 2020-12-30 DIAGNOSIS — N926 Irregular menstruation, unspecified: Secondary | ICD-10-CM

## 2020-12-30 DIAGNOSIS — D649 Anemia, unspecified: Secondary | ICD-10-CM | POA: Insufficient documentation

## 2020-12-30 HISTORY — DX: Irregular menstruation, unspecified: N92.6
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
## 2021-02-02 ENCOUNTER — Ambulatory Visit: Payer: BC Managed Care – PPO | Admitting: Neurology

## 2021-05-27 ENCOUNTER — Other Ambulatory Visit: Payer: Self-pay | Admitting: Gastroenterology

## 2021-05-27 DIAGNOSIS — R748 Abnormal levels of other serum enzymes: Secondary | ICD-10-CM

## 2022-02-16 ENCOUNTER — Other Ambulatory Visit: Payer: Self-pay

## 2022-02-16 ENCOUNTER — Encounter (HOSPITAL_COMMUNITY): Admission: EM | Disposition: A | Discharge status: Home / Self Care | Payer: Self-pay | Source: Home / Self Care

## 2022-02-16 ENCOUNTER — Emergency Department (HOSPITAL_COMMUNITY): Payer: BC Managed Care – PPO

## 2022-02-16 ENCOUNTER — Emergency Department (HOSPITAL_COMMUNITY): Payer: BC Managed Care – PPO | Admitting: Certified Registered Nurse Anesthetist

## 2022-02-16 ENCOUNTER — Encounter (HOSPITAL_COMMUNITY): Payer: Self-pay | Admitting: Emergency Medicine

## 2022-02-16 ENCOUNTER — Inpatient Hospital Stay (HOSPITAL_COMMUNITY)
Admission: EM | Admit: 2022-02-16 | Discharge: 2022-02-19 | DRG: 331 | Disposition: A | Discharge status: Home / Self Care | Payer: BC Managed Care – PPO | Attending: Surgery | Admitting: Surgery

## 2022-02-16 DIAGNOSIS — Z818 Family history of other mental and behavioral disorders: Secondary | ICD-10-CM

## 2022-02-16 DIAGNOSIS — Z87891 Personal history of nicotine dependence: Secondary | ICD-10-CM | POA: Diagnosis not present

## 2022-02-16 DIAGNOSIS — Z803 Family history of malignant neoplasm of breast: Secondary | ICD-10-CM

## 2022-02-16 DIAGNOSIS — F32A Depression, unspecified: Secondary | ICD-10-CM | POA: Diagnosis present

## 2022-02-16 DIAGNOSIS — I10 Essential (primary) hypertension: Secondary | ICD-10-CM | POA: Diagnosis present

## 2022-02-16 DIAGNOSIS — Z833 Family history of diabetes mellitus: Secondary | ICD-10-CM | POA: Diagnosis not present

## 2022-02-16 DIAGNOSIS — Z811 Family history of alcohol abuse and dependence: Secondary | ICD-10-CM | POA: Diagnosis not present

## 2022-02-16 DIAGNOSIS — Z91040 Latex allergy status: Secondary | ICD-10-CM | POA: Diagnosis not present

## 2022-02-16 DIAGNOSIS — K589 Irritable bowel syndrome without diarrhea: Secondary | ICD-10-CM | POA: Diagnosis present

## 2022-02-16 DIAGNOSIS — Z975 Presence of (intrauterine) contraceptive device: Secondary | ICD-10-CM

## 2022-02-16 DIAGNOSIS — Z9049 Acquired absence of other specified parts of digestive tract: Secondary | ICD-10-CM

## 2022-02-16 DIAGNOSIS — Z20822 Contact with and (suspected) exposure to covid-19: Secondary | ICD-10-CM | POA: Diagnosis present

## 2022-02-16 DIAGNOSIS — Z888 Allergy status to other drugs, medicaments and biological substances status: Secondary | ICD-10-CM

## 2022-02-16 DIAGNOSIS — K3532 Acute appendicitis with perforation and localized peritonitis, without abscess: Principal | ICD-10-CM | POA: Diagnosis present

## 2022-02-16 DIAGNOSIS — L0291 Cutaneous abscess, unspecified: Secondary | ICD-10-CM | POA: Diagnosis present

## 2022-02-16 DIAGNOSIS — N946 Dysmenorrhea, unspecified: Secondary | ICD-10-CM | POA: Diagnosis present

## 2022-02-16 DIAGNOSIS — Z91041 Radiographic dye allergy status: Secondary | ICD-10-CM

## 2022-02-16 DIAGNOSIS — R1031 Right lower quadrant pain: Secondary | ICD-10-CM | POA: Diagnosis present

## 2022-02-16 DIAGNOSIS — Z8249 Family history of ischemic heart disease and other diseases of the circulatory system: Secondary | ICD-10-CM

## 2022-02-16 DIAGNOSIS — Z79899 Other long term (current) drug therapy: Secondary | ICD-10-CM

## 2022-02-16 DIAGNOSIS — F909 Attention-deficit hyperactivity disorder, unspecified type: Secondary | ICD-10-CM | POA: Diagnosis present

## 2022-02-16 HISTORY — PX: LAPAROSCOPIC APPENDECTOMY: SHX408

## 2022-02-16 HISTORY — PX: COLOSTOMY REVISION: SHX5232

## 2022-02-16 HISTORY — PX: DIAGNOSTIC LARYNGOSCOPY: SHX5368

## 2022-02-16 HISTORY — PX: LAPAROTOMY: SHX154

## 2022-02-16 HISTORY — PX: IRRIGATION AND DEBRIDEMENT ABSCESS: SHX5252

## 2022-02-16 HISTORY — PX: COLON RESECTION: SHX5231

## 2022-02-16 LAB — TYPE AND SCREEN
ABO/RH(D): A POS
Antibody Screen: NEGATIVE

## 2022-02-16 LAB — I-STAT BETA HCG BLOOD, ED (MC, WL, AP ONLY): I-stat hCG, quantitative: <5 m[IU]/mL (ref <5)

## 2022-02-16 LAB — COMPREHENSIVE METABOLIC PANEL
ALT: 24 U/L (ref 0–44)
AST: 32 U/L (ref 15–41)
Albumin: 3.2 g/dL — ABNORMAL LOW (ref 3.5–5.0)
Alkaline Phosphatase: 127 U/L — ABNORMAL HIGH (ref 38–126)
Anion gap: 12 (ref 5–15)
BUN: 6 mg/dL (ref 6–20)
CO2: 29 mmol/L (ref 22–32)
Calcium: 8.7 mg/dL — ABNORMAL LOW (ref 8.9–10.3)
Chloride: 92 mmol/L — ABNORMAL LOW (ref 98–111)
Creatinine, Ser: 0.7 mg/dL (ref 0.44–1.00)
GFR, Estimated: >60 mL/min (ref >60)
Glucose, Bld: 108 mg/dL — ABNORMAL HIGH (ref 70–99)
Potassium: 2.9 mmol/L — ABNORMAL LOW (ref 3.5–5.1)
Sodium: 133 mmol/L — ABNORMAL LOW (ref 135–145)
Total Bilirubin: 1.6 mg/dL — ABNORMAL HIGH (ref 0.3–1.2)
Total Protein: 7.1 g/dL (ref 6.5–8.1)

## 2022-02-16 LAB — RESP PANEL BY RT-PCR (FLU A&B, COVID) ARPGX2
Influenza A by PCR: NEGATIVE
Influenza B by PCR: NEGATIVE
SARS Coronavirus 2 by RT PCR: NEGATIVE

## 2022-02-16 LAB — CBC WITH DIFFERENTIAL/PLATELET
Abs Immature Granulocytes: 0.05 10*3/uL (ref 0.00–0.07)
Basophils Absolute: 0 10*3/uL (ref 0.0–0.1)
Basophils Relative: 0 %
Eosinophils Absolute: 0.1 10*3/uL (ref 0.0–0.5)
Eosinophils Relative: 0 %
HCT: 39.1 % (ref 36.0–46.0)
Hemoglobin: 14.1 g/dL (ref 12.0–15.0)
Immature Granulocytes: 0 %
Lymphocytes Relative: 19 %
Lymphs Abs: 2.3 10*3/uL (ref 0.7–4.0)
MCH: 37.4 pg — ABNORMAL HIGH (ref 26.0–34.0)
MCHC: 36.1 g/dL — ABNORMAL HIGH (ref 30.0–36.0)
MCV: 103.7 fL — ABNORMAL HIGH (ref 80.0–100.0)
Monocytes Absolute: 1.6 10*3/uL — ABNORMAL HIGH (ref 0.1–1.0)
Monocytes Relative: 14 %
Neutro Abs: 8 10*3/uL — ABNORMAL HIGH (ref 1.7–7.7)
Neutrophils Relative %: 67 %
Platelets: 205 10*3/uL (ref 150–400)
RBC: 3.77 MIL/uL — ABNORMAL LOW (ref 3.87–5.11)
RDW: 12.2 % (ref 11.5–15.5)
WBC: 12.1 10*3/uL — ABNORMAL HIGH (ref 4.0–10.5)
nRBC: 0 % (ref 0.0–0.2)

## 2022-02-16 LAB — ABO/RH: ABO/RH(D): A POS

## 2022-02-16 LAB — LIPASE, BLOOD: Lipase: 28 U/L (ref 11–51)

## 2022-02-16 SURGERY — LARYNGOSCOPY, DIAGNOSTIC
Anesthesia: General | Site: Abdomen

## 2022-02-16 MED ORDER — KETOROLAC TROMETHAMINE 15 MG/ML IJ SOLN
30.0000 mg | Freq: Four times a day (QID) | INTRAMUSCULAR | Status: DC
  Administered 2022-02-16 – 2022-02-18 (×7): 30 mg via INTRAVENOUS
  Filled 2022-02-16 (×7): qty 2

## 2022-02-16 MED ORDER — MIDAZOLAM HCL 2 MG/2ML IJ SOLN
INTRAMUSCULAR | Status: DC | PRN
Start: 2022-02-16 — End: 2022-02-16
  Administered 2022-02-16: 2 mg via INTRAVENOUS

## 2022-02-16 MED ORDER — PROPOFOL 10 MG/ML IV BOLUS
INTRAVENOUS | Status: AC
  Filled 2022-02-16: qty 20

## 2022-02-16 MED ORDER — LACTATED RINGERS IV SOLN: INTRAVENOUS | Status: DC

## 2022-02-16 MED ORDER — ONDANSETRON HCL 4 MG/2ML IJ SOLN
4.0000 mg | Freq: Four times a day (QID) | INTRAMUSCULAR | Status: DC | PRN
  Administered 2022-02-16: 4 mg via INTRAVENOUS

## 2022-02-16 MED ORDER — SUGAMMADEX SODIUM 200 MG/2ML IV SOLN
INTRAVENOUS | Status: DC | PRN
  Administered 2022-02-16: 200 mg via INTRAVENOUS

## 2022-02-16 MED ORDER — CHLORHEXIDINE GLUCONATE 0.12 % MT SOLN: 15.0000 mL | Freq: Once | OROMUCOSAL | Status: AC

## 2022-02-16 MED ORDER — ONDANSETRON HCL 4 MG/2ML IJ SOLN
INTRAMUSCULAR | Status: AC
  Filled 2022-02-16: qty 2

## 2022-02-16 MED ORDER — SUCCINYLCHOLINE CHLORIDE 200 MG/10ML IV SOSY
PREFILLED_SYRINGE | INTRAVENOUS | Status: AC
  Filled 2022-02-16: qty 10

## 2022-02-16 MED ORDER — ORAL CARE MOUTH RINSE: 15.0000 mL | Freq: Once | OROMUCOSAL | Status: AC

## 2022-02-16 MED ORDER — SODIUM CHLORIDE 0.9 % IV SOLN
2.0000 g | Freq: Once | INTRAVENOUS | Status: AC
  Administered 2022-02-16: 2 g via INTRAVENOUS
  Filled 2022-02-16: qty 20

## 2022-02-16 MED ORDER — DEXAMETHASONE SODIUM PHOSPHATE 10 MG/ML IJ SOLN
INTRAMUSCULAR | Status: AC
  Filled 2022-02-16: qty 1

## 2022-02-16 MED ORDER — CHLORHEXIDINE GLUCONATE 0.12 % MT SOLN
OROMUCOSAL | Status: AC
  Administered 2022-02-16: 15 mL via OROMUCOSAL
  Filled 2022-02-16: qty 15

## 2022-02-16 MED ORDER — FENTANYL CITRATE (PF) 250 MCG/5ML IJ SOLN
INTRAMUSCULAR | Status: DC | PRN
  Administered 2022-02-16 (×2): 50 ug via INTRAVENOUS
  Administered 2022-02-16 (×2): 75 ug via INTRAVENOUS

## 2022-02-16 MED ORDER — DOCUSATE SODIUM 100 MG PO CAPS
100.0000 mg | ORAL_CAPSULE | Freq: Two times a day (BID) | ORAL | Status: DC
  Administered 2022-02-16 – 2022-02-19 (×6): 100 mg via ORAL
  Filled 2022-02-16 (×6): qty 1

## 2022-02-16 MED ORDER — LIDOCAINE 2% (20 MG/ML) 5 ML SYRINGE
INTRAMUSCULAR | Status: DC | PRN
  Administered 2022-02-16: 100 mg via INTRAVENOUS

## 2022-02-16 MED ORDER — ROCURONIUM BROMIDE 10 MG/ML (PF) SYRINGE
PREFILLED_SYRINGE | INTRAVENOUS | Status: DC | PRN
  Administered 2022-02-16: 20 mg via INTRAVENOUS
  Administered 2022-02-16: 50 mg via INTRAVENOUS

## 2022-02-16 MED ORDER — SODIUM CHLORIDE 0.9 % IV SOLN
2.0000 g | INTRAVENOUS | Status: DC
  Administered 2022-02-17 – 2022-02-18 (×2): 2 g via INTRAVENOUS
  Filled 2022-02-16 (×2): qty 20

## 2022-02-16 MED ORDER — ONDANSETRON 4 MG PO TBDP
4.0000 mg | ORAL_TABLET | Freq: Four times a day (QID) | ORAL | Status: DC | PRN
Start: 2022-02-16 — End: 2022-02-19

## 2022-02-16 MED ORDER — BUPIVACAINE-EPINEPHRINE (PF) 0.25% -1:200000 IJ SOLN
INTRAMUSCULAR | Status: AC
  Filled 2022-02-16: qty 30

## 2022-02-16 MED ORDER — MORPHINE SULFATE (PF) 2 MG/ML IV SOLN
2.0000 mg | INTRAVENOUS | Status: DC | PRN
  Administered 2022-02-17: 2 mg via INTRAVENOUS
  Filled 2022-02-16: qty 1

## 2022-02-16 MED ORDER — ROCURONIUM BROMIDE 10 MG/ML (PF) SYRINGE
PREFILLED_SYRINGE | INTRAVENOUS | Status: AC
  Filled 2022-02-16: qty 10

## 2022-02-16 MED ORDER — METRONIDAZOLE 500 MG/100ML IV SOLN
500.0000 mg | Freq: Once | INTRAVENOUS | Status: AC
  Administered 2022-02-16: 500 mg via INTRAVENOUS
  Filled 2022-02-16: qty 100

## 2022-02-16 MED ORDER — DEXMEDETOMIDINE (PRECEDEX) IN NS 20 MCG/5ML (4 MCG/ML) IV SYRINGE
PREFILLED_SYRINGE | INTRAVENOUS | Status: AC
  Filled 2022-02-16: qty 5

## 2022-02-16 MED ORDER — 0.9 % SODIUM CHLORIDE (POUR BTL) OPTIME
TOPICAL | Status: DC | PRN
  Administered 2022-02-16: 2000 mL
  Administered 2022-02-16: 1000 mL
  Administered 2022-02-16: 3000 mL

## 2022-02-16 MED ORDER — HYDROMORPHONE HCL 1 MG/ML IJ SOLN
0.2500 mg | INTRAMUSCULAR | Status: DC | PRN
  Administered 2022-02-16 (×4): 0.5 mg via INTRAVENOUS

## 2022-02-16 MED ORDER — ACETAMINOPHEN 325 MG PO TABS
650.0000 mg | ORAL_TABLET | Freq: Once | ORAL | Status: AC
  Administered 2022-02-16: 650 mg via ORAL
  Filled 2022-02-16: qty 2

## 2022-02-16 MED ORDER — ONDANSETRON HCL 4 MG/2ML IJ SOLN
INTRAMUSCULAR | Status: DC | PRN
Start: 2022-02-16 — End: 2022-02-16
  Administered 2022-02-16: 4 mg via INTRAVENOUS

## 2022-02-16 MED ORDER — DIPHENHYDRAMINE HCL 50 MG/ML IJ SOLN
INTRAMUSCULAR | Status: DC | PRN
  Administered 2022-02-16: 12.5 mg via INTRAVENOUS

## 2022-02-16 MED ORDER — BUPIVACAINE-EPINEPHRINE 0.25% -1:200000 IJ SOLN
INTRAMUSCULAR | Status: DC | PRN
  Administered 2022-02-16: 10 mL

## 2022-02-16 MED ORDER — OXYCODONE HCL 5 MG/5ML PO SOLN
5.0000 mg | ORAL | Status: DC | PRN
  Administered 2022-02-17 (×2): 10 mg via ORAL
  Filled 2022-02-16 (×2): qty 10

## 2022-02-16 MED ORDER — PROPOFOL 10 MG/ML IV BOLUS
INTRAVENOUS | Status: DC | PRN
  Administered 2022-02-16: 170 mg via INTRAVENOUS

## 2022-02-16 MED ORDER — METHOCARBAMOL 500 MG PO TABS
1000.0000 mg | ORAL_TABLET | Freq: Three times a day (TID) | ORAL | Status: DC
  Administered 2022-02-16 – 2022-02-19 (×8): 1000 mg via ORAL
  Filled 2022-02-16 (×8): qty 2

## 2022-02-16 MED ORDER — DIPHENHYDRAMINE HCL 50 MG/ML IJ SOLN
INTRAMUSCULAR | Status: AC
  Filled 2022-02-16: qty 1

## 2022-02-16 MED ORDER — LIDOCAINE 2% (20 MG/ML) 5 ML SYRINGE
INTRAMUSCULAR | Status: AC
  Filled 2022-02-16: qty 5

## 2022-02-16 MED ORDER — ARTIFICIAL TEARS OPHTHALMIC OINT
TOPICAL_OINTMENT | OPHTHALMIC | Status: AC
  Filled 2022-02-16: qty 3.5

## 2022-02-16 MED ORDER — HYDROMORPHONE HCL 1 MG/ML IJ SOLN
INTRAMUSCULAR | Status: AC
  Filled 2022-02-16: qty 1

## 2022-02-16 MED ORDER — SUCCINYLCHOLINE CHLORIDE 200 MG/10ML IV SOSY
PREFILLED_SYRINGE | INTRAVENOUS | Status: DC | PRN
  Administered 2022-02-16: 140 mg via INTRAVENOUS

## 2022-02-16 MED ORDER — DEXAMETHASONE SODIUM PHOSPHATE 10 MG/ML IJ SOLN
INTRAMUSCULAR | Status: DC | PRN
  Administered 2022-02-16: 5 mg via INTRAVENOUS

## 2022-02-16 MED ORDER — METRONIDAZOLE 500 MG/100ML IV SOLN
500.0000 mg | Freq: Two times a day (BID) | INTRAVENOUS | Status: DC
  Administered 2022-02-17 – 2022-02-19 (×5): 500 mg via INTRAVENOUS
  Filled 2022-02-16 (×5): qty 100

## 2022-02-16 MED ORDER — PROPOFOL 500 MG/50ML IV EMUL
INTRAVENOUS | Status: DC | PRN
Start: 2022-02-16 — End: 2022-02-16
  Administered 2022-02-16: 25 ug/kg/min via INTRAVENOUS

## 2022-02-16 MED ORDER — ACETAMINOPHEN 500 MG PO TABS
1000.0000 mg | ORAL_TABLET | Freq: Four times a day (QID) | ORAL | Status: DC
Start: 2022-02-16 — End: 2022-02-19
  Administered 2022-02-16 – 2022-02-19 (×10): 1000 mg via ORAL
  Filled 2022-02-16 (×11): qty 2

## 2022-02-16 MED ORDER — LACTATED RINGERS IV SOLN
INTRAVENOUS | Status: DC | PRN
Start: 2022-02-16 — End: 2022-02-16

## 2022-02-16 MED ORDER — FENTANYL CITRATE (PF) 250 MCG/5ML IJ SOLN
INTRAMUSCULAR | Status: AC
  Filled 2022-02-16: qty 5

## 2022-02-16 MED ORDER — ENOXAPARIN SODIUM 40 MG/0.4ML IJ SOSY
40.0000 mg | PREFILLED_SYRINGE | INTRAMUSCULAR | Status: DC
  Administered 2022-02-17 – 2022-02-18 (×2): 40 mg via SUBCUTANEOUS
  Filled 2022-02-16 (×2): qty 0.4

## 2022-02-16 MED ORDER — MIDAZOLAM HCL 2 MG/2ML IJ SOLN
INTRAMUSCULAR | Status: AC
  Filled 2022-02-16: qty 2

## 2022-02-16 SURGICAL SUPPLY — 90 items
APPLIER CLIP ROT 10 11.4 M/L (STAPLE)
BAG COUNTER SPONGE SURGICOUNT (BAG) ×2 IMPLANT
BLADE CLIPPER SURG (BLADE) IMPLANT
CANISTER SUCT 3000ML PPV (MISCELLANEOUS) ×2 IMPLANT
CELLS DAT CNTRL 66122 CELL SVR (MISCELLANEOUS) ×3 IMPLANT
CHLORAPREP W/TINT 26 (MISCELLANEOUS) ×4 IMPLANT
CLIP APPLIE ROT 10 11.4 M/L (STAPLE) IMPLANT
COVER MAYO STAND STRL (DRAPES) ×2 IMPLANT
COVER SURGICAL LIGHT HANDLE (MISCELLANEOUS) ×4 IMPLANT
CUTTER FLEX LINEAR 45M (STAPLE) ×2 IMPLANT
DERMABOND ADHESIVE PROPEN (GAUZE/BANDAGES/DRESSINGS) ×1
DERMABOND ADVANCED .7 DNX6 (GAUZE/BANDAGES/DRESSINGS) ×3 IMPLANT
DRAPE LAPAROSCOPIC ABDOMINAL (DRAPES) ×4 IMPLANT
DRAPE UNIVERSAL (DRAPES) ×2 IMPLANT
DRAPE WARM FLUID 44X44 (DRAPES) ×4 IMPLANT
DRSG COVADERM PLUS 2X2 (GAUZE/BANDAGES/DRESSINGS) ×6 IMPLANT
DRSG OPSITE POSTOP 4X10 (GAUZE/BANDAGES/DRESSINGS) IMPLANT
DRSG OPSITE POSTOP 4X8 (GAUZE/BANDAGES/DRESSINGS) ×2 IMPLANT
ELECT BLADE 6.5 EXT (BLADE) ×2 IMPLANT
ELECT CAUTERY BLADE 6.4 (BLADE) ×4 IMPLANT
ELECT REM PT RETURN 9FT ADLT (ELECTROSURGICAL) ×4
ELECTRODE REM PT RTRN 9FT ADLT (ELECTROSURGICAL) ×3 IMPLANT
ENDOLOOP SUT PDS II  0 18 (SUTURE)
ENDOLOOP SUT PDS II 0 18 (SUTURE) IMPLANT
GLOVE SRG 8 PF TXTR STRL LF DI (GLOVE) ×1 IMPLANT
GLOVE SURG ENC MOIS LTX SZ6.5 (GLOVE) ×4 IMPLANT
GLOVE SURG POLYISO LF SZ7.5 (GLOVE) ×2 IMPLANT
GLOVE SURG UNDER POLY LF SZ6 (GLOVE) ×6 IMPLANT
GLOVE SURG UNDER POLY LF SZ8 (GLOVE) ×1
GOWN STRL REUS W/ TWL LRG LVL3 (GOWN DISPOSABLE) ×10 IMPLANT
GOWN STRL REUS W/ TWL XL LVL3 (GOWN DISPOSABLE) ×1 IMPLANT
GOWN STRL REUS W/TWL LRG LVL3 (GOWN DISPOSABLE) ×4
GOWN STRL REUS W/TWL XL LVL3 (GOWN DISPOSABLE) ×1
HANDLE SUCTION POOLE (INSTRUMENTS) ×3 IMPLANT
IRRIG SUCT STRYKERFLOW 2 WTIP (MISCELLANEOUS) ×4
IRRIGATION SUCT STRKRFLW 2 WTP (MISCELLANEOUS) ×1 IMPLANT
KIT BASIN OR (CUSTOM PROCEDURE TRAY) ×4 IMPLANT
KIT TURNOVER KIT B (KITS) ×4 IMPLANT
LIGASURE IMPACT 36 18CM CVD LR (INSTRUMENTS) IMPLANT
NDL INSUFFLATION 14GA 120MM (NEEDLE) IMPLANT
NEEDLE INSUFFLATION 14GA 120MM (NEEDLE) IMPLANT
NS IRRIG 1000ML POUR BTL (IV SOLUTION) ×16 IMPLANT
PACK GENERAL/GYN (CUSTOM PROCEDURE TRAY) ×2 IMPLANT
PAD ARMBOARD 7.5X6 YLW CONV (MISCELLANEOUS) ×8 IMPLANT
PENCIL BUTTON HOLSTER BLD 10FT (ELECTRODE) ×6 IMPLANT
PENCIL SMOKE EVACUATOR (MISCELLANEOUS) ×2 IMPLANT
POUCH SPECIMEN RETRIEVAL 10MM (ENDOMECHANICALS) ×2 IMPLANT
RELOAD 45 VASCULAR/THIN (ENDOMECHANICALS) IMPLANT
RELOAD PROXIMATE 75MM BLUE (ENDOMECHANICALS) ×12 IMPLANT
RELOAD STAPLE 45 2.5 WHT GRN (ENDOMECHANICALS) IMPLANT
RELOAD STAPLE 45 3.5 BLU ETS (ENDOMECHANICALS) IMPLANT
RELOAD STAPLE 75 3.8 BLU REG (ENDOMECHANICALS) IMPLANT
RELOAD STAPLE TA45 3.5 REG BLU (ENDOMECHANICALS) IMPLANT
RETRACTOR WND ALEXIS 18 MED (MISCELLANEOUS) IMPLANT
RTRCTR WOUND ALEXIS 18CM MED (MISCELLANEOUS) ×4
SCISSORS LAP 5X35 DISP (ENDOMECHANICALS) ×2 IMPLANT
SET IRRIG TUBING LAPAROSCOPIC (IRRIGATION / IRRIGATOR) IMPLANT
SET TUBE SMOKE EVAC HIGH FLOW (TUBING) ×4 IMPLANT
SHEARS HARMONIC ACE PLUS 36CM (ENDOMECHANICALS) ×2 IMPLANT
SHEET MEDIUM DRAPE 40X70 STRL (DRAPES) ×4 IMPLANT
SLEEVE ENDOPATH XCEL 5M (ENDOMECHANICALS) ×4 IMPLANT
SPECIMEN JAR SMALL (MISCELLANEOUS) ×2 IMPLANT
SPONGE T-LAP 18X18 ~~LOC~~+RFID (SPONGE) ×4 IMPLANT
STAPLER PROXIMATE 75MM BLUE (STAPLE) ×2 IMPLANT
STAPLER VISISTAT 35W (STAPLE) ×6 IMPLANT
SUCTION POOLE HANDLE (INSTRUMENTS) ×4
SUT MNCRL AB 4-0 PS2 18 (SUTURE) ×2 IMPLANT
SUT PDS AB 1 TP1 54 (SUTURE) IMPLANT
SUT PDS AB 1 TP1 96 (SUTURE) IMPLANT
SUT PDS AB 2-0 CT1 27 (SUTURE) ×8 IMPLANT
SUT SILK 2 0 SH CR/8 (SUTURE) ×2 IMPLANT
SUT SILK 2 0 TIES 10X30 (SUTURE) ×2 IMPLANT
SUT SILK 3 0 SH CR/8 (SUTURE) ×2 IMPLANT
SUT SILK 3 0 TIES 10X30 (SUTURE) ×2 IMPLANT
SUT VIC AB 2-0 SH 18 (SUTURE) ×6 IMPLANT
SUT VIC AB 2-0 SH 27 (SUTURE) ×1
SUT VIC AB 2-0 SH 27X BRD (SUTURE) ×1 IMPLANT
SUT VIC AB 3-0 SH 18 (SUTURE) IMPLANT
SUT VICRYL 0 UR6 27IN ABS (SUTURE) ×2 IMPLANT
SYR BULB EAR ULCER 3OZ GRN STR (SYRINGE) ×2 IMPLANT
SYR BULB IRRIG 60ML STRL (SYRINGE) ×2 IMPLANT
TOWEL GREEN STERILE (TOWEL DISPOSABLE) ×4 IMPLANT
TOWEL GREEN STERILE FF (TOWEL DISPOSABLE) ×4 IMPLANT
TRAY FOLEY MTR SLVR 16FR STAT (SET/KITS/TRAYS/PACK) IMPLANT
TRAY LAPAROSCOPIC MC (CUSTOM PROCEDURE TRAY) ×4 IMPLANT
TROCAR XCEL BLUNT TIP 100MML (ENDOMECHANICALS) ×2 IMPLANT
TROCAR XCEL NON-BLD 5MMX100MML (ENDOMECHANICALS) ×4 IMPLANT
TUBE CONNECTING 12X1/4 (SUCTIONS) ×2 IMPLANT
WATER STERILE IRR 1000ML POUR (IV SOLUTION) ×4 IMPLANT
YANKAUER SUCT BULB TIP NO VENT (SUCTIONS) ×2 IMPLANT

## 2022-02-16 NOTE — Anesthesia Procedure Notes (Addendum)
Procedure Name: Intubation Date/Time: 02/16/2022 4:35 PM Performed by: Reece Agar, CRNA Pre-anesthesia Checklist: Patient identified, Emergency Drugs available, Suction available and Patient being monitored Patient Re-evaluated:Patient Re-evaluated prior to induction Oxygen Delivery Method: Circle System Utilized Preoxygenation: Pre-oxygenation with 100% oxygen Induction Type: IV induction, Rapid sequence and Cricoid Pressure applied Ventilation: Mask ventilation without difficulty Laryngoscope Size: Mac and 3 Grade View: Grade I Tube type: Oral Tube size: 7.0 mm Number of attempts: 1 Airway Equipment and Method: Stylet Placement Confirmation: ETT inserted through vocal cords under direct vision, positive ETCO2 and breath sounds checked- equal and bilateral Secured at: 21 cm Tube secured with: Tape Dental Injury: Teeth and Oropharynx as per pre-operative assessment
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
## 2022-02-16 NOTE — Op Note (Signed)
Patient: Tonya Holt (Nov 30, 1991, [id number])  Date of Surgery: 02/16/2022   Preoperative Diagnosis: Possible appendicitis verses possible mesenteric injury   Postoperative Diagnosis: Appendicitis   Surgical Procedure:  Laparoscopic assisted right colectomy  Operative Team Members:  Surgeon(s) and Role:    * Lovick, Lennie Odor, MD - Primary    * Tamora Huneke, Hyman Hopes, MD - Assisting    * Abigail Miyamoto, MD - Spectator  Anesthesiologist: Beryle Lathe, MD; Dorris Singh, MD; Jairo Ben, MD CRNA: Gwenyth Allegra, CRNA; Tressia Miners, CRNA   Anesthesia: General   Fluids:  Total I/O In: 600 [I.V.:600] Out: 20 [Blood:20]  Complications: None  Drains:  none   Specimen:  ID Type Source Tests Collected by Time Destination  1 : Right colon Tissue PATH GI Other SURGICAL PATHOLOGY Diamantina Monks, MD 02/16/2022 1819      Disposition:  PACU - hemodynamically stable.  Plan of Care: Admit to inpatient     Indications for Procedure: Tonya Holt is a 31 y.o. female who presented with a strange history and imaging that raised concern for ileocolic mesenteric injury versus appendicitis.  The decision was made to proceed to the operating room for exploration, starting laparoscopic and possibly requiring a laparotomy.  We discussed the possibility of appendectomy, right colectomy or other procedure depending on the findings of the exploration.  The procedure itself as well as its risks, benefits and alternatives were discussed.  The risks discussed included but were not limited to the risk of infection, bleeding, damage to nearby structures, and anastomotic leak.  After a full discussion and all questions answered the patient granted consent to proceed.  Findings: Dense inflammation surrounding the cecum and into the ileocolic mesentary.  Possibly severe retrocecal appendicitis but difficult to tell on gross inspection in the operating room.   Description of  Procedure:   Please note that this case occurred at the end of a shift.  Dr. Bedelia Person began the case, then Dr. Bedelia Person and Dr. Dossie Der worked together on the case, and in the end Dr. Dossie Der performed the clean closure.  Due to the findings during the case, Dr. Magnus Ivan was also called into the room to observe the case and provided counsel from a senior partner.  On the date stated above the patient taken operating suite and placed in supine position.  General endotracheal anesthesia was induced.  A timeout was completed verifying the correct patient, procedure, position, and equipment needed for the case.  The patient's abdomen was prepped and draped in usual sterile fashion.    A periumbilical Roseanne Reno approach was used to enter the abdomen.  The abdomen was insufflated to 15 mmHg.  A left upper and left lower quadrant 5 mm trocar were placed.  There is no trauma to the underlying viscera with initial trocar placement.  Later in the case a 5 mm trocar was placed suprapubically to aid with retraction.  The abdomen was inspected.  There was dense inflammation in the right lower quadrant with the cecum inflamed and stuck to the right abdominal sidewall.  There is fatty inflammation surrounding the base of the cecum making it difficult to identify the tinea and difficult to follow them down to the base the appendix.  Attempts were made to mobilize the cecum off the abdominal sidewall and identify the appendix.  It appeared that it wrapped retrocecally.  The terminal ileum was identified and run to the cecum.  We were able to identify the ileocecal valve.  The  mesentery of the terminal ileum was firm and full, which may have represented lymph nodes, hematoma cavity, or an inflamed appendix hiding behind it.  We are unable to identify the base the appendix and the cecum and did not feel comfortable planning on performing just an appendectomy so we decided to perform a right colectomy.  The right colon was  mobilized at the white line of Toldt and medialized.  The attachments of the right colon to the liver were divided.  We were careful to protect the duodenum during this portion of the case.  We worked to stay out of the retroperitoneum to protect the ureter.  The terminal ileum was also mobilized so that the entire specimen could be lifted to a small midline laparotomy incision.  We worked to divide the mesentery of the terminal ileum and the mesocolon of the right colon using the harmonic.  The ileocolic pedicle was identified and ligated laparoscopically using a Vicryl suture and then divided using the harmonic.  At this point the specimen felt fully mobilized.  We made a small midline laparotomy incision, large enough to insert our hand and then placed a wound protector.  The specimen was delivered out of the midline laparotomy incision and we divided the terminal ileum proximal to the divided mesentery and the transverse colon just distal to the hepatic flexure.  The specimen was passed off the field as right colon.  We then proceeded to create our ileocolic anastomosis.  The tinea of the colon was lined up with the antimesenteric border of the small bowel using a Vicryl stitch.  Enterotomies were created in the small bowel and transverse colon.  A GIA 75 stapler was inserted and fired to create the anastomosis.  The common enterotomy was closed with a another load of the GIA 75 stapler.  The staple line of the enterotomy closure and the staple lines near this were oversewn with 2-0 Vicryl suture.  A 2-0 Vicryl suture was placed to support the crotch of the anastomosis.  The omentum was tacked down over the anastomosis using 2-0 Vicryl suture.  The anastomosis was returned to the abdomen and then the abdomen was suctioned clean using multiple liters of saline irrigation.  The wound protector was removed and we scrubbed out and reprepped in accordance with the colon surgery protocol.  The midline laparotomy was  then closed at the fascial level using running 2-0 PDS suture.  The wound was irrigated with saline.  The deep dermal layer was closed using 2-0 Vicryl suture.  The skin was closed using staples.  All sponge and needle counts were correct at the end of this case.  At the end of the case we reviewed the infection status of the case. Patient: [facility] Patient Case: Emergent Infection Present At Time Of Surgery (PATOS):  Infection around the cecum related to appendicitis and or trauma  Ivar Drape, MD General, Bariatric, & Minimally Invasive Surgery [facility], [place]

## 2022-02-16 NOTE — ED Notes (Signed)
Patient transported to CT 

## 2022-02-16 NOTE — Anesthesia Preprocedure Evaluation (Signed)
Anesthesia Evaluation  Patient identified by MRN, date of birth, ID band Patient awake    Reviewed: Allergy & Precautions, NPO status , Patient's Chart, lab work & pertinent test results  History of Anesthesia Complications (+) PONV and history of anesthetic complications  Airway Mallampati: II       Dental   Pulmonary former smoker,    breath sounds clear to auscultation       Cardiovascular hypertension,  Rhythm:Regular     Neuro/Psych    GI/Hepatic Neg liver ROS, History  Noted Dr. Chilton Si   Endo/Other    Renal/GU negative Renal ROS     Musculoskeletal   Abdominal   Peds  Hematology   Anesthesia Other Findings   Reproductive/Obstetrics                             Anesthesia Physical Anesthesia Plan  ASA: 1 and emergent  Anesthesia Plan: General   Post-op Pain Management:    Induction:   PONV Risk Score and Plan: Dexamethasone, Ondansetron and Propofol infusion  Airway Management Planned:   Additional Equipment:   Intra-op Plan:   Post-operative Plan: Extubation in OR  Informed Consent:     Dental advisory given  Plan Discussed with: CRNA and Anesthesiologist  Anesthesia Plan Comments:         Anesthesia Quick Evaluation

## 2022-02-16 NOTE — Transfer of Care (Signed)
Immediate Anesthesia Transfer of Care Note  Patient: Tonya Holt  Procedure(s) Performed: DIAGNOSTIC LAPAROTOMY (Abdomen) APPENDECTOMY (Abdomen) POSSIBLE EXPLORATORY LAPAROTOMY (Abdomen) COLON RESECTION RIGHT (Abdomen) IRRIGATION AND DEBRIDEMENT INTRA-ABDOMINAL ABSCESS (Abdomen)  Patient Location: PACU  Anesthesia Type:General  Level of Consciousness: drowsy  Airway & Oxygen Therapy: Patient Spontanous Breathing and Patient connected to nasal cannula oxygen  Post-op Assessment: Report given to RN and Post -op Vital signs reviewed and stable  Post vital signs: Reviewed and stable  Last Vitals:  Vitals Value Taken Time  BP 123/80 02/16/22 1925  Temp 36.9 C 02/16/22 1925  Pulse 91 02/16/22 1925  Resp 19 02/16/22 1925  SpO2 100 % 02/16/22 1925  Vitals shown include unvalidated device data.  Last Pain:  Vitals:   02/16/22 1534  TempSrc:   PainSc: 4          Complications: No notable events documented.
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
## 2022-02-16 NOTE — Anesthesia Postprocedure Evaluation (Signed)
Anesthesia Post Note  Patient: MATRICE HERRO  Procedure(s) Performed: DIAGNOSTIC LAPAROTOMY (Abdomen) APPENDECTOMY (Abdomen) POSSIBLE EXPLORATORY LAPAROTOMY (Abdomen) COLON RESECTION RIGHT (Abdomen) IRRIGATION AND DEBRIDEMENT INTRA-ABDOMINAL ABSCESS (Abdomen)     Patient location during evaluation: PACU Anesthesia Type: General Level of consciousness: awake and alert Pain management: pain level controlled Vital Signs Assessment: post-procedure vital signs reviewed and stable Respiratory status: spontaneous breathing, nonlabored ventilation and respiratory function stable Cardiovascular status: stable and blood pressure returned to baseline Anesthetic complications: no   No notable events documented.  Last Vitals:  Vitals:   02/16/22 2010 02/16/22 2025  BP: 129/83 120/72  Pulse: 86 92  Resp: 13 12  Temp:  36.4 C  SpO2: 98% 92%    Last Pain:  Vitals:   02/16/22 2025  TempSrc:   PainSc: 4                  Beryle Lathe
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
## 2022-02-16 NOTE — ED Notes (Signed)
Patient declined tylenol in triage.

## 2022-02-17 ENCOUNTER — Encounter (HOSPITAL_COMMUNITY): Payer: Self-pay | Admitting: Surgery

## 2022-02-17 LAB — BASIC METABOLIC PANEL
Anion gap: 13 (ref 5–15)
BUN: 6 mg/dL (ref 6–20)
CO2: 26 mmol/L (ref 22–32)
Calcium: 7.9 mg/dL — ABNORMAL LOW (ref 8.9–10.3)
Chloride: 94 mmol/L — ABNORMAL LOW (ref 98–111)
Creatinine, Ser: 0.66 mg/dL (ref 0.44–1.00)
GFR, Estimated: >60 mL/min (ref >60)
Glucose, Bld: 132 mg/dL — ABNORMAL HIGH (ref 70–99)
Potassium: 2.9 mmol/L — ABNORMAL LOW (ref 3.5–5.1)
Sodium: 133 mmol/L — ABNORMAL LOW (ref 135–145)

## 2022-02-17 LAB — CBC
HCT: 32.6 % — ABNORMAL LOW (ref 36.0–46.0)
Hemoglobin: 12.1 g/dL (ref 12.0–15.0)
MCH: 37.8 pg — ABNORMAL HIGH (ref 26.0–34.0)
MCHC: 37.1 g/dL — ABNORMAL HIGH (ref 30.0–36.0)
MCV: 101.9 fL — ABNORMAL HIGH (ref 80.0–100.0)
Platelets: 180 10*3/uL (ref 150–400)
RBC: 3.2 MIL/uL — ABNORMAL LOW (ref 3.87–5.11)
RDW: 12.1 % (ref 11.5–15.5)
WBC: 11.8 10*3/uL — ABNORMAL HIGH (ref 4.0–10.5)
nRBC: 0 % (ref 0.0–0.2)

## 2022-02-17 LAB — MAGNESIUM: Magnesium: 1.5 mg/dL — ABNORMAL LOW (ref 1.7–2.4)

## 2022-02-17 LAB — HIV ANTIBODY (ROUTINE TESTING W REFLEX): HIV Screen 4th Generation wRfx: NONREACTIVE

## 2022-02-17 MED ORDER — CHLORDIAZEPOXIDE HCL 25 MG PO CAPS
25.0000 mg | ORAL_CAPSULE | Freq: Two times a day (BID) | ORAL | Status: DC
  Administered 2022-02-17: 25 mg via ORAL
  Filled 2022-02-17: qty 1

## 2022-02-17 MED ORDER — VENLAFAXINE HCL ER 75 MG PO CP24
150.0000 mg | ORAL_CAPSULE | Freq: Every morning | ORAL | Status: DC
Start: 2022-02-17 — End: 2022-02-19
  Administered 2022-02-18 – 2022-02-19 (×2): 150 mg via ORAL
  Filled 2022-02-17 (×3): qty 2

## 2022-02-17 MED ORDER — BRIMONIDINE TARTRATE 0.15 % OP SOLN
1.0000 [drp] | Freq: Every morning | OPHTHALMIC | Status: DC
  Filled 2022-02-17: qty 5

## 2022-02-17 MED ORDER — POTASSIUM CHLORIDE 10 MEQ/100ML IV SOLN
10.0000 meq | INTRAVENOUS | Status: AC
  Administered 2022-02-17 (×3): 10 meq via INTRAVENOUS

## 2022-02-17 MED ORDER — CALCIUM CARBONATE ANTACID 500 MG PO CHEW: 1.0000 | CHEWABLE_TABLET | Freq: Four times a day (QID) | ORAL | Status: DC | PRN

## 2022-02-17 MED ORDER — BISOPROLOL-HYDROCHLOROTHIAZIDE 5-6.25 MG PO TABS
2.0000 | ORAL_TABLET | Freq: Every morning | ORAL | Status: DC
  Filled 2022-02-17 (×2): qty 2

## 2022-02-17 MED ORDER — POTASSIUM CHLORIDE 10 MEQ/100ML IV SOLN
10.0000 meq | INTRAVENOUS | Status: AC
  Administered 2022-02-17 (×3): 10 meq via INTRAVENOUS
  Filled 2022-02-17 (×6): qty 100

## 2022-02-17 MED ORDER — LISDEXAMFETAMINE DIMESYLATE 50 MG PO CAPS: 50.0000 mg | ORAL_CAPSULE | Freq: Every morning | ORAL | Status: DC

## 2022-02-17 MED ORDER — VENLAFAXINE HCL ER 75 MG PO CP24
75.0000 mg | ORAL_CAPSULE | Freq: Every day | ORAL | Status: DC
Start: 2022-02-17 — End: 2022-02-17
  Administered 2022-02-17: 75 mg via ORAL
  Filled 2022-02-17: qty 1

## 2022-02-17 MED ORDER — LISDEXAMFETAMINE DIMESYLATE 30 MG PO CAPS
30.0000 mg | ORAL_CAPSULE | Freq: Every day | ORAL | Status: DC
  Administered 2022-02-18 – 2022-02-19 (×2): 30 mg via ORAL
  Filled 2022-02-17 (×3): qty 1

## 2022-02-17 MED ORDER — MAGNESIUM SULFATE 2 GM/50ML IV SOLN
2.0000 g | Freq: Once | INTRAVENOUS | Status: AC
  Administered 2022-02-17: 2 g via INTRAVENOUS
  Filled 2022-02-17: qty 50

## 2022-02-17 MED ORDER — PANTOPRAZOLE SODIUM 40 MG PO TBEC
40.0000 mg | DELAYED_RELEASE_TABLET | Freq: Every day | ORAL | Status: DC
  Administered 2022-02-17 – 2022-02-19 (×3): 40 mg via ORAL
  Filled 2022-02-17 (×3): qty 1

## 2022-02-17 MED ORDER — POTASSIUM CHLORIDE 20 MEQ PO PACK
40.0000 meq | PACK | Freq: Two times a day (BID) | ORAL | Status: AC
  Administered 2022-02-17: 40 meq via ORAL
  Filled 2022-02-17: qty 2

## 2022-02-17 MED ORDER — FOLIC ACID 1 MG PO TABS
1.0000 mg | ORAL_TABLET | Freq: Every day | ORAL | Status: DC
Start: 2022-02-17 — End: 2022-02-17
  Administered 2022-02-17: 1 mg via ORAL
  Filled 2022-02-17: qty 1

## 2022-02-17 MED ORDER — LISDEXAMFETAMINE DIMESYLATE 20 MG PO CAPS
20.0000 mg | ORAL_CAPSULE | Freq: Every day | ORAL | Status: DC
  Administered 2022-02-18 – 2022-02-19 (×2): 20 mg via ORAL
  Filled 2022-02-17 (×3): qty 1

## 2022-02-17 NOTE — Progress Notes (Signed)
Trauma Event Note   TRN rounded on patient. Reports bowel movement today, ambulating with therapies. Denies pain, states excellent pain control. Happy with hospitalization so far. Pt stable at this time, A/Ox4 VS WDL. Looking forward to advancing diet, states she's hungry.  Last imported Vital Signs BP 106/69 (BP Location: Right Arm)    Pulse 68    Temp 98.6 F (37 C) (Oral)    Resp 15    Wt 158 lb 15.2 oz (72.1 kg)    SpO2 98%    BMI 28.16 kg/m   Trending CBC Recent Labs    02/16/22 1222 02/17/22 0702  WBC 12.1* 11.8*  HGB 14.1 12.1  HCT 39.1 32.6*  PLT 205 180    Trending Coag's No results for input(s): APTT, INR in the last 72 hours.  Trending BMET Recent Labs    02/16/22 1222 02/17/22 0702  NA 133* 133*  K 2.9* 2.9*  CL 92* 94*  CO2 29 26  BUN 6 6  CREATININE 0.70 0.66  GLUCOSE 108* 132*      Erric Machnik O Brandice Busser  Trauma Response RN  Please call TRN at [phone] for further assistance.
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
## 2022-02-17 NOTE — Progress Notes (Signed)
Mobility Specialist: Progress Note   02/17/22 1254  Mobility  Bed Position Chair  Activity Ambulated independently in hallway  Level of Assistance Independent  Assistive Device None  Distance Ambulated (ft) 640 ft  Activity Response Tolerated well  $Mobility charge 1 Mobility   Pt received in recliner and agreeable to mobility. Independent with c/o minimal abdominal pain during ambulation, otherwise asymptomatic. Pt back to recliner after walk with call bell at her side. Some swelling noted at IV site in pt's R forearm, RN notified.   [facility] Adam Sanjuan Mobility Specialist Mobility Specialist 5 North: [phone] Mobility Specialist 6 North: [phone]

## 2022-02-17 NOTE — TOC CAGE-AID Note (Signed)
Transition of Care [facility]) - CAGE-AID Screening   Patient Details  Name: Tonya Holt MRN: [id number] Date of Birth: 10-23-1991  Transition of Care [facility]) CM/SW Contact:    Jyasia Markoff C Tarpley-Carter, LCSWA Phone Number: 02/17/2022, 11:29 AM   Clinical Narrative: Pt participated in Cage-Aid.  Pt stated she does not use substance or ETOH.  Pt was not offered resources, due to no usage of substance or ETOH.     Chelci Wintermute Tarpley-Carter, MSW, LCSW-A Pronouns:  She/Her/Hers [place] Transitions of Care Clinical Social Worker Direct Number:  [phone] [email or web address]    CAGE-AID Screening:    Have You Ever Felt You Ought to Cut Down on Your Drinking or Drug Use?: No Have People Annoyed You By [employer] Your Drinking Or Drug Use?: No Have You Felt Bad Or Guilty About Your Drinking Or Drug Use?: No Have You Ever Had a Drink or Used Drugs First Thing In The Morning to Steady Your Nerves or to Get Rid of a Hangover?: No CAGE-AID Score: 0  Substance Abuse Education Offered: No
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
## 2022-02-18 LAB — BASIC METABOLIC PANEL
Anion gap: 8 (ref 5–15)
BUN: 6 mg/dL (ref 6–20)
CO2: 26 mmol/L (ref 22–32)
Calcium: 7.7 mg/dL — ABNORMAL LOW (ref 8.9–10.3)
Chloride: 101 mmol/L (ref 98–111)
Creatinine, Ser: 0.56 mg/dL (ref 0.44–1.00)
GFR, Estimated: >60 mL/min (ref >60)
Glucose, Bld: 89 mg/dL (ref 70–99)
Potassium: 2.9 mmol/L — ABNORMAL LOW (ref 3.5–5.1)
Sodium: 135 mmol/L (ref 135–145)

## 2022-02-18 LAB — CBC
HCT: 29.7 % — ABNORMAL LOW (ref 36.0–46.0)
Hemoglobin: 10.8 g/dL — ABNORMAL LOW (ref 12.0–15.0)
MCH: 37.9 pg — ABNORMAL HIGH (ref 26.0–34.0)
MCHC: 36.4 g/dL — ABNORMAL HIGH (ref 30.0–36.0)
MCV: 104.2 fL — ABNORMAL HIGH (ref 80.0–100.0)
Platelets: 187 10*3/uL (ref 150–400)
RBC: 2.85 MIL/uL — ABNORMAL LOW (ref 3.87–5.11)
RDW: 12.1 % (ref 11.5–15.5)
WBC: 7.7 10*3/uL (ref 4.0–10.5)
nRBC: 0 % (ref 0.0–0.2)

## 2022-02-18 LAB — MAGNESIUM: Magnesium: 2.1 mg/dL (ref 1.7–2.4)

## 2022-02-18 MED ORDER — POTASSIUM CHLORIDE 10 MEQ/100ML IV SOLN
10.0000 meq | INTRAVENOUS | Status: AC
  Administered 2022-02-18 (×3): 10 meq via INTRAVENOUS
  Filled 2022-02-18 (×3): qty 100

## 2022-02-18 MED ORDER — POTASSIUM CHLORIDE 20 MEQ PO PACK
40.0000 meq | PACK | Freq: Two times a day (BID) | ORAL | Status: DC
  Filled 2022-02-18: qty 2

## 2022-02-18 MED ORDER — POTASSIUM CHLORIDE CRYS ER 20 MEQ PO TBCR
40.0000 meq | EXTENDED_RELEASE_TABLET | Freq: Two times a day (BID) | ORAL | Status: AC
  Administered 2022-02-18 (×2): 40 meq via ORAL
  Filled 2022-02-18 (×2): qty 2

## 2022-02-18 MED ORDER — HYDROCHLOROTHIAZIDE 12.5 MG PO TABS
12.5000 mg | ORAL_TABLET | Freq: Every day | ORAL | Status: DC
  Administered 2022-02-19: 12.5 mg via ORAL
  Filled 2022-02-18 (×2): qty 1

## 2022-02-18 MED ORDER — BISOPROLOL FUMARATE 10 MG PO TABS
10.0000 mg | ORAL_TABLET | Freq: Every day | ORAL | Status: DC
  Administered 2022-02-18 – 2022-02-19 (×2): 10 mg via ORAL
  Filled 2022-02-18 (×2): qty 1

## 2022-02-18 MED ORDER — MORPHINE SULFATE (PF) 2 MG/ML IV SOLN: 2.0000 mg | INTRAVENOUS | Status: DC | PRN

## 2022-02-18 MED ORDER — OXYCODONE HCL 5 MG PO TABS
5.0000 mg | ORAL_TABLET | ORAL | Status: DC | PRN
  Administered 2022-02-18 – 2022-02-19 (×3): 10 mg via ORAL
  Filled 2022-02-18 (×3): qty 2
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
## 2022-02-18 NOTE — TOC Progression Note (Signed)
Transition of Care [facility]) - Progression Note    Patient Details  Name: Tonya Holt MRN: [id number] Date of Birth: 19-Aug-1991  Transition of Care [facility]) CM/SW Contact  Angelita Ingles, RN Phone Number:[phone]  02/18/2022, 4:33 PM  Clinical Narrative:     Transition of Care [facility]) Screening Note   Patient Details  Name: Tonya Holt Date of Birth: 1991/11/28   Transition of Care [facility]) CM/SW Contact:    Angelita Ingles, RN Phone Number: 02/18/2022, 4:33 PM    Transition of Care Department [facility]) has reviewed patient and no TOC needs have been identified at this time. We will continue to monitor patient advancement through interdisciplinary progression rounds. If new patient transition needs arise, please place a TOC consult.          Expected Discharge Plan and Services           Expected Discharge Date: 02/19/22                                     Social Determinants of Health (SDOH) Interventions    Readmission Risk Interventions No flowsheet data found.

## 2022-02-18 NOTE — Discharge Instructions (Addendum)
May shower beginning 02/19/2022. May allow warm soapy water to run over incisions, then rinse and pat dry. Do not soak in any water (tubs, hot tubs, pools, lakes, oceans) for one week after surgery.   No lifting greater than 5 pounds for six weeks. May resume sexual activity when it is comfortable.   Pain regimen: take over-the-counter tylenol (acetaminophen) 1000mg  every six hours, the prescription ibuprofen (600mg ) every six hours and the robaxin (methocarbamol) 750mg  every six hours. With all three of these, you should be taking something every two hours. Example: tylenol ( acetaminophen) at 8am, ibuprofen at 10am, robaxin (methocarbamol) at 12pm, tylenol (acetaminophen) again at 2pm, ibuprofen again at 4pm, robaxin (methocarbamol) at 6pm. You also have a prescription for oxycodone, which should be taken if the tylenol (acetaminophen), ibuprofen, and robaxin (methocarbamol) are not enough to control your pain. You may take the oxycodone as frequently as every four hours as needed, but if you are taking the other medications as above, you should not need the oxycodone this frequently. You have also been given a prescription for colace (docusate) which is a stool softener. Please take this as prescribed because the oxycodone can cause constipation and the colace (docusate) will minimize or prevent constipation. Do not drive while taking or under the influence of the oxycodone as it is a narcotic medication.  Call the office at [phone] for temperature greater than 101.17F, worsening pain, redness or warmth at the incision site.  Please call [phone] to make an appointment for 2-3 weeks after surgery for wound check.     [facility], [place] [phone]  OPEN ABDOMINAL SURGERY: POST OP INSTRUCTIONS  Always review your discharge instruction sheet given to you by the facility where your surgery was performed.  IF YOU HAVE DISABILITY OR FAMILY LEAVE FORMS, YOU MUST BRING THEM  TO THE OFFICE FOR PROCESSING.  PLEASE DO NOT GIVE THEM TO YOUR DOCTOR.   Take your usually prescribed medications unless otherwise directed. If you need a refill on your pain medication, please contact your pharmacy. They will contact our office to request authorization.  Prescriptions will not be filled after 5pm or on week-ends. You should follow a light diet the first few days after arrival home, such as soup and crackers, pudding, etc.unless your doctor has advised otherwise. A high-fiber, low fat diet can be resumed as tolerated.   Be sure to include lots of fluids daily. Most patients will experience some swelling and bruising on the chest and neck area.  Ice packs will help.  Swelling and bruising can take several days to resolve Most patients will experience some swelling and bruising in the area of the incision. Ice pack will help. Swelling and bruising can take several days to resolve..  It is common to experience some constipation if taking pain medication after surgery.  Increasing fluid intake and taking a stool softener will usually help or prevent this problem from occurring.  A mild laxative (Milk of Magnesia or Miralax) should be taken according to package directions if there are no bowel movements after 48 hours.  You may have steri-strips (small skin tapes) in place directly over the incision.  These strips should be left on the skin for 7-10 days.  If your surgeon used skin glue on the incision, you may shower in 24 hours.  The glue will flake off over the next 2-3 weeks.  Any sutures or staples will be removed at the office during your  follow-up visit. You may find that a light gauze bandage over your incision may keep your staples from being rubbed or pulled. You may shower and replace the bandage daily. ACTIVITIES:  You may resume regular (light) daily activities beginning the next day--such as daily self-care, walking, climbing stairs--gradually increasing activities as tolerated.   You may have sexual intercourse when it is comfortable.  Refrain from any heavy lifting or straining until approved by your doctor. You may drive when you no longer are taking prescription pain medication, you can comfortably wear a seatbelt, and you can safely maneuver your car and apply brakes Return to Work: ___________________________________ Dennis Bast should see your doctor in the office for a follow-up appointment approximately two weeks after your surgery.  Make sure that you call for this appointment within a day or two after you arrive home to insure a convenient appointment time. OTHER INSTRUCTIONS:  _____________________________________________________________ _____________________________________________________________  WHEN TO CALL YOUR DOCTOR: Fever over 101.5 Inability to urinate Nausea and/or vomiting Extreme swelling or bruising Continued bleeding from incision. Increased pain, redness, or drainage from the incision. Difficulty swallowing or breathing Muscle cramping or spasms. Numbness or tingling in hands or feet or around lips.  The clinic staff is available to answer your questions during regular business hours.  Please dont hesitate to call and ask to speak to one of the nurses if you have concerns.  For further questions, please visit [email or web address]

## 2022-02-18 NOTE — Progress Notes (Signed)
Mobility Specialist: Progress Note   02/18/22 1446  Mobility  Bed Position Chair  Activity Ambulated independently in hallway  Level of Assistance Independent  Assistive Device None  Distance Ambulated (ft) 530 ft  Activity Response Tolerated well  $Mobility charge 1 Mobility   Pt received in chair and agreeable to ambulation. C/o minimal abdominal pain during ambulation, otherwise asymptomatic. Pt back to recliner after ambulation with call bell and phone at her side.   [facility] [place] Mobility Specialist Mobility Specialist 5 North: [phone] Mobility Specialist 6 North: [phone]
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
## 2022-02-19 ENCOUNTER — Encounter (HOSPITAL_COMMUNITY): Payer: Self-pay | Admitting: Surgery

## 2022-02-19 ENCOUNTER — Other Ambulatory Visit (HOSPITAL_COMMUNITY): Payer: Self-pay

## 2022-02-19 LAB — BASIC METABOLIC PANEL
Anion gap: 7 (ref 5–15)
BUN: <5 mg/dL — ABNORMAL LOW (ref 6–20)
CO2: 22 mmol/L (ref 22–32)
Calcium: 7.7 mg/dL — ABNORMAL LOW (ref 8.9–10.3)
Chloride: 103 mmol/L (ref 98–111)
Creatinine, Ser: 0.55 mg/dL (ref 0.44–1.00)
GFR, Estimated: >60 mL/min (ref >60)
Glucose, Bld: 111 mg/dL — ABNORMAL HIGH (ref 70–99)
Potassium: 3.7 mmol/L (ref 3.5–5.1)
Sodium: 132 mmol/L — ABNORMAL LOW (ref 135–145)

## 2022-02-19 LAB — SURGICAL PATHOLOGY

## 2022-02-19 MED ORDER — ACETAMINOPHEN 500 MG PO TABS
1000.0000 mg | ORAL_TABLET | Freq: Four times a day (QID) | ORAL | 2 refills | Status: AC | PRN
  Filled 2022-02-19: qty 60, 8d supply, fill #0

## 2022-02-19 MED ORDER — DOCUSATE SODIUM 100 MG PO CAPS
100.0000 mg | ORAL_CAPSULE | Freq: Two times a day (BID) | ORAL | 2 refills | Status: AC
  Filled 2022-02-19: qty 60, 30d supply, fill #0

## 2022-02-19 MED ORDER — METHOCARBAMOL 750 MG PO TABS
750.0000 mg | ORAL_TABLET | Freq: Four times a day (QID) | ORAL | 1 refills | Status: AC
  Filled 2022-02-19: qty 120, 30d supply, fill #0

## 2022-02-19 MED ORDER — IBUPROFEN 600 MG PO TABS
600.0000 mg | ORAL_TABLET | Freq: Four times a day (QID) | ORAL | 1 refills | Status: AC | PRN
  Filled 2022-02-19: qty 120, 30d supply, fill #0

## 2022-02-19 MED ORDER — OXYCODONE HCL 5 MG PO TABS
5.0000 mg | ORAL_TABLET | ORAL | 0 refills | Status: AC | PRN
  Filled 2022-02-19: qty 30, 5d supply, fill #0
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
## 2022-02-19 NOTE — Progress Notes (Incomplete)
Pt with orders to d/c home. Assessment complete pt is stable. IV removed. Medications delivered to room from [facility]. Pt provided with d/c education and packet. Pt and all belongings transported to private vehicle via wheelchair.

## 2022-03-15 ENCOUNTER — Other Ambulatory Visit: Payer: Self-pay | Admitting: Student

## 2022-03-15 ENCOUNTER — Other Ambulatory Visit (HOSPITAL_BASED_OUTPATIENT_CLINIC_OR_DEPARTMENT_OTHER): Payer: Self-pay | Admitting: Student

## 2022-03-15 DIAGNOSIS — R1011 Right upper quadrant pain: Secondary | ICD-10-CM

## 2022-03-17 ENCOUNTER — Other Ambulatory Visit: Payer: Self-pay

## 2022-03-17 ENCOUNTER — Ambulatory Visit (HOSPITAL_BASED_OUTPATIENT_CLINIC_OR_DEPARTMENT_OTHER)
Admission: RE | Admit: 2022-03-17 | Discharge: 2022-03-17 | Disposition: A | Discharge status: Home / Self Care | Payer: BC Managed Care – PPO | Source: Ambulatory Visit | Attending: Student | Admitting: Student

## 2022-03-17 DIAGNOSIS — R1011 Right upper quadrant pain: Secondary | ICD-10-CM | POA: Diagnosis present

## 2022-03-17 MED ORDER — IOHEXOL 300 MG/ML  SOLN
100.0000 mL | Freq: Once | INTRAMUSCULAR | Status: AC | PRN
Start: 2022-03-17 — End: 2022-03-17
  Administered 2022-03-17: 75 mL via INTRAVENOUS

## 2022-10-19 ENCOUNTER — Other Ambulatory Visit: Payer: Self-pay | Admitting: Gastroenterology

## 2022-10-19 DIAGNOSIS — R748 Abnormal levels of other serum enzymes: Secondary | ICD-10-CM

## 2022-10-22 ENCOUNTER — Ambulatory Visit
Admission: RE | Admit: 2022-10-22 | Discharge: 2022-10-22 | Disposition: A | Discharge status: Home / Self Care | Payer: BC Managed Care – PPO | Source: Ambulatory Visit | Attending: Gastroenterology | Admitting: Gastroenterology

## 2022-10-22 DIAGNOSIS — R748 Abnormal levels of other serum enzymes: Secondary | ICD-10-CM | POA: Diagnosis present
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
## 2023-11-22 IMAGING — CT CT ABD-PELV W/ CM
2 of 4 series · 15 of 46 positions shown, 17 images · IV contrast (APPLIED)
Comparison: 02/16/2022

CLINICAL DATA: Right lower quadrant pain following prior
appendectomy

EXAM:
CT ABDOMEN AND PELVIS WITH CONTRAST
TECHNIQUE: Multidetector CT imaging of the abdomen and pelvis was performed
using the standard protocol following bolus administration of
intravenous contrast.

[Series 2: abd pel w · axial · 0.75mm/px · z∈[+820,+1230]mm · 12 of 90 slices shown, 14 images]
[im 4/90  soft-tissue]
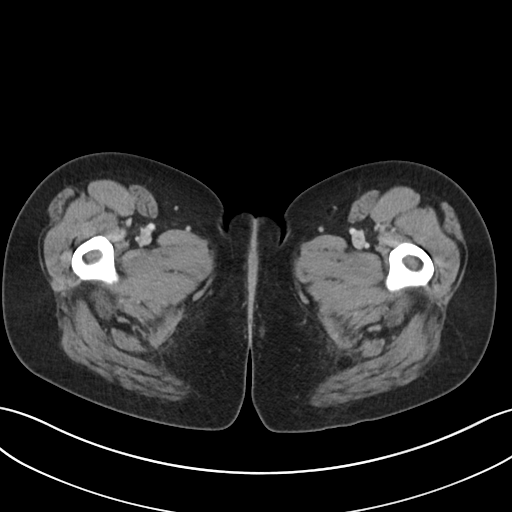
[im 4/90  bone]
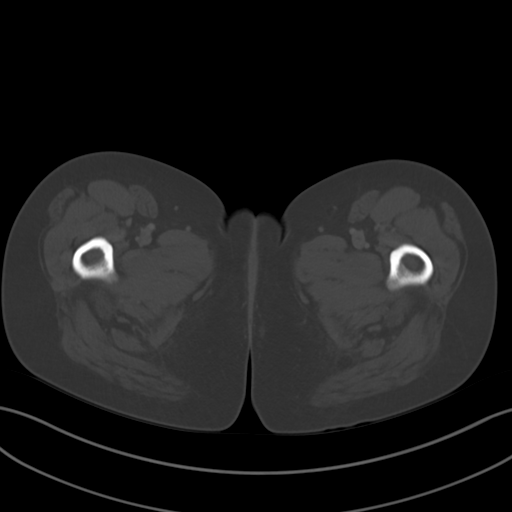
[im 12/90  soft-tissue]
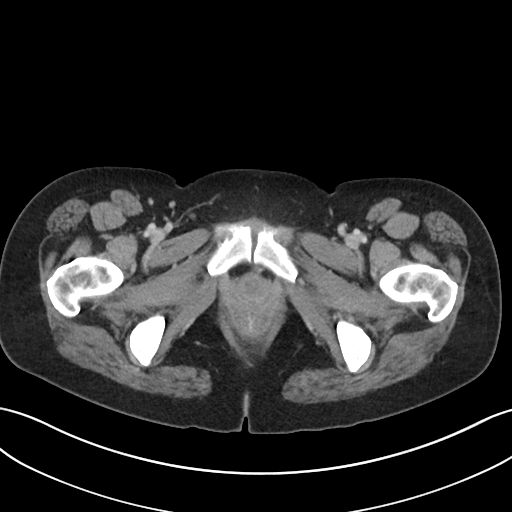
[im 19/90  soft-tissue]
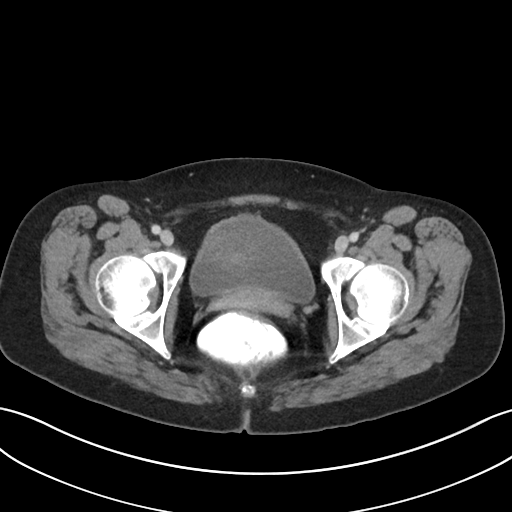
[im 26/90  soft-tissue]
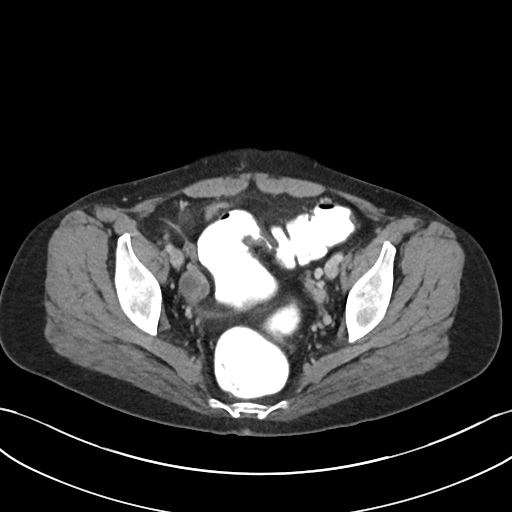
[im 34/90  soft-tissue]
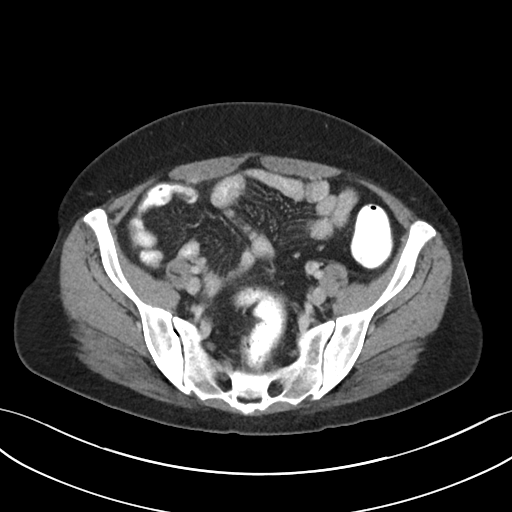
[im 41/90  soft-tissue]
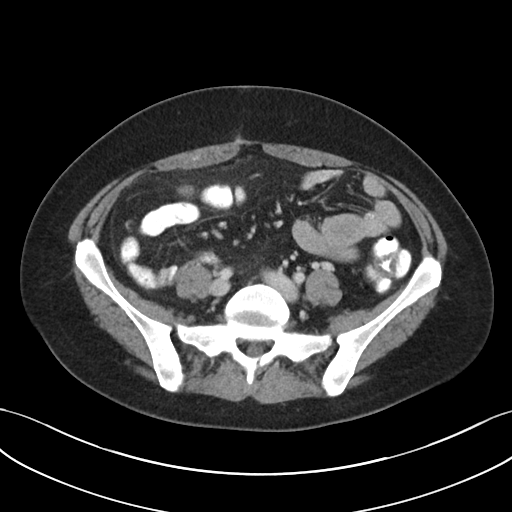
[im 49/90  soft-tissue]
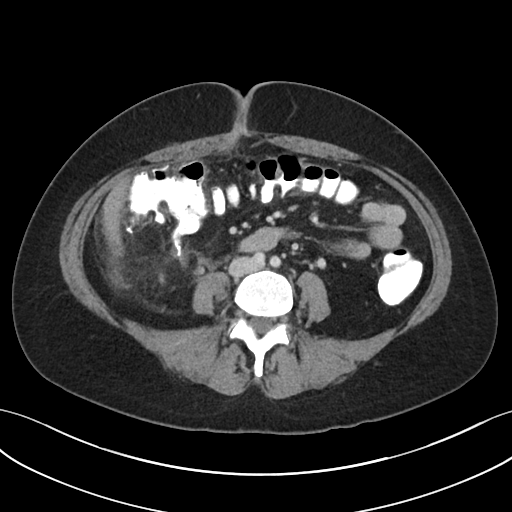
[im 56/90  soft-tissue]
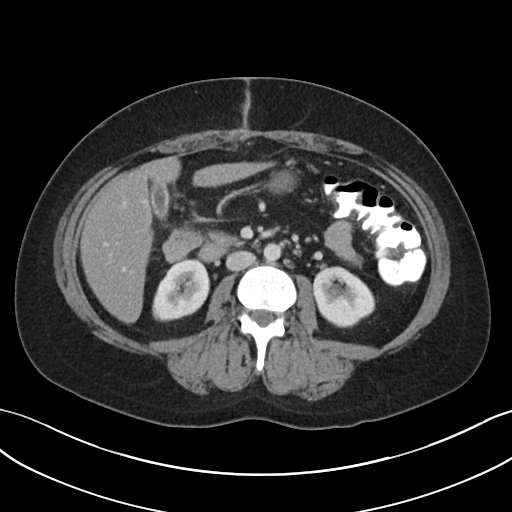
[im 64/90  soft-tissue]
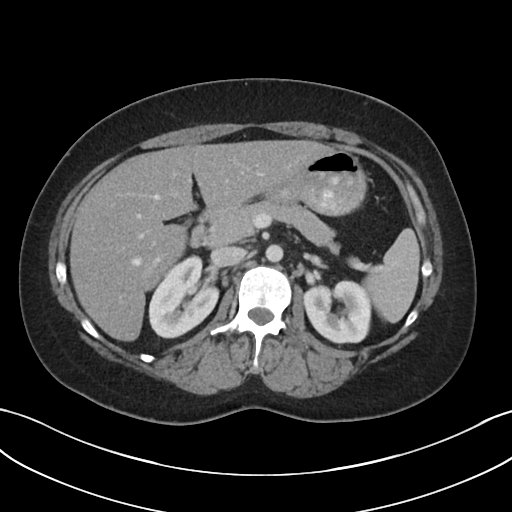
[im 64/90  bone]
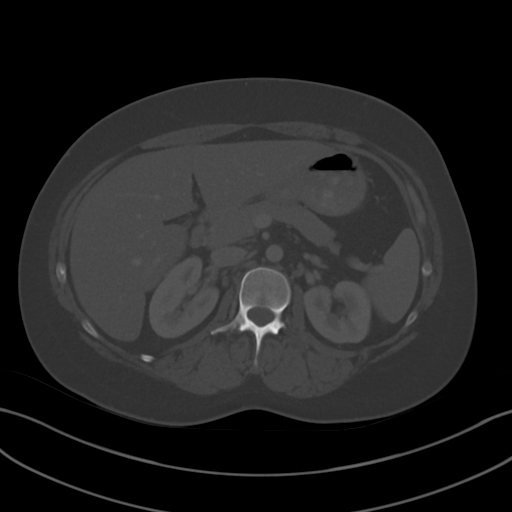
[im 71/90  soft-tissue]
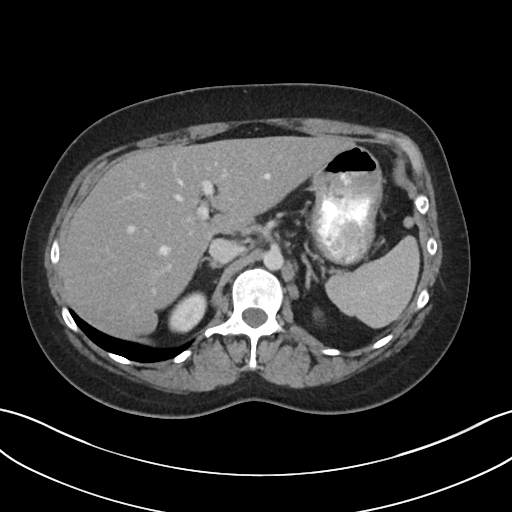
[im 78/90  soft-tissue]
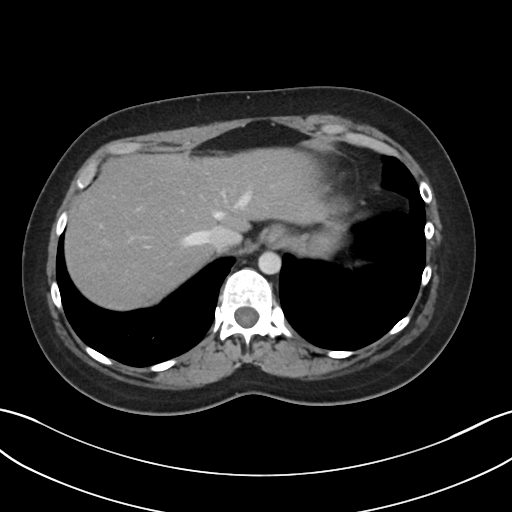
[im 86/90  soft-tissue]
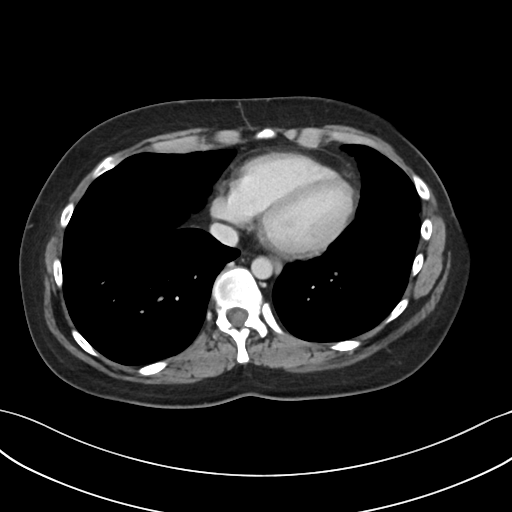

[Series 5: coronal · coronal · 0.71mm/px · 3 of 91 slices shown]
[im 31/91  soft-tissue]
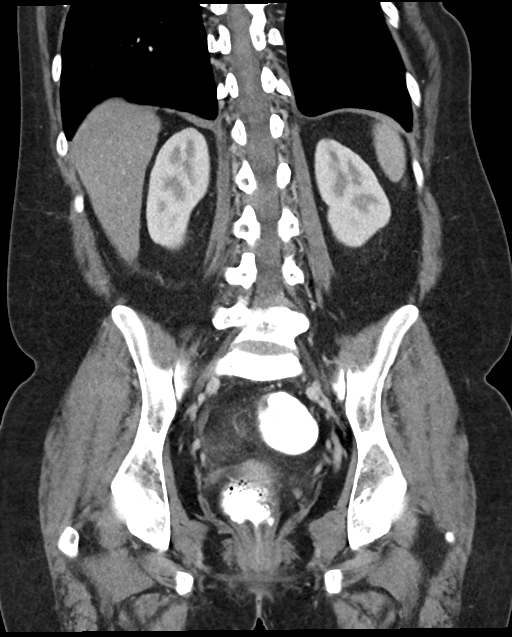
[im 41/91  soft-tissue]
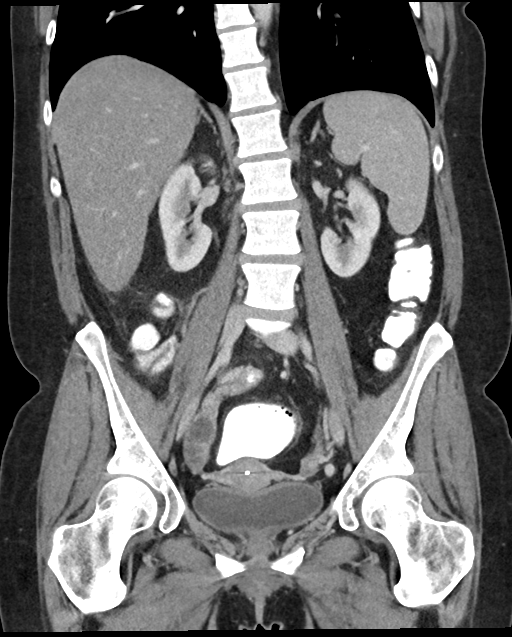
[im 51/91  soft-tissue]
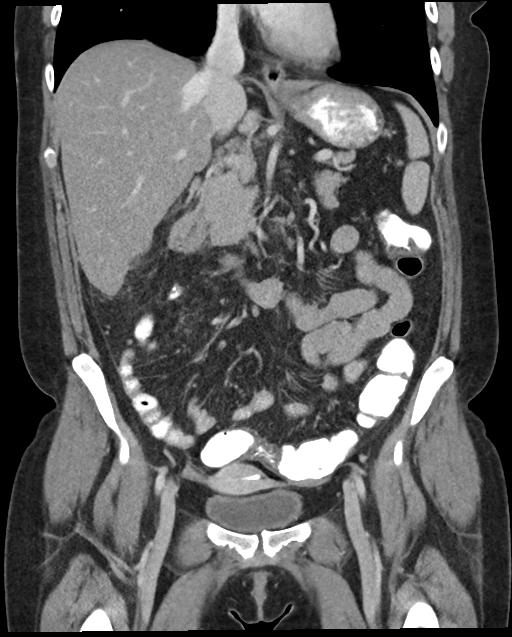

[15 of 46 positions shown; findings below may reference images not displayed]

RADIATION DOSE REDUCTION: This exam was performed according to the
departmental dose-optimization program which includes automated
exposure control, adjustment of the mA and/or kV according to
patient size and/or use of iterative reconstruction technique.

CONTRAST:  75mL OMNIPAQUE IOHEXOL 300 MG/ML  SOLN
FINDINGS: Lower chest: No acute abnormality.

Hepatobiliary: No focal liver abnormality is seen. No gallstones,
gallbladder wall thickening, or biliary dilatation.

Pancreas: Unremarkable. No pancreatic ductal dilatation or
surrounding inflammatory changes.

Spleen: Normal in size without focal abnormality.

Adrenals/Urinary Tract: Adrenal glands are within normal limits.
Kidneys demonstrate a normal enhancement pattern bilaterally. No
obstructive changes are seen. The bladder is partially distended.

Stomach/Bowel: There are changes consistent with prior right
colectomy. Small bowel to colonic anastomosis appears widely patent
with contrast material within. No findings to suggest perforation or
breakdown of the anastomosis are seen. No definitive anastomotic
leak is noted. Mild inflammatory changes are noted consistent with
the recent surgery. More proximal small bowel and stomach are within
normal limits.

Vascular/Lymphatic: No significant vascular findings are present. No
enlarged abdominal or pelvic lymph nodes.

Reproductive: IUD is noted within the uterus. 3 cm simple cyst is
noted within the right ovary slightly more prominent than that seen
on the prior exam.

Other: No abdominal wall hernia or abnormality. No abdominopelvic
ascites.

Musculoskeletal: No acute or significant osseous findings.
IMPRESSION: Changes consistent with prior appendectomy and right colectomy.
Patent anastomosis is noted without evidence of breakdown or
anastomotic leak.

Mild inflammatory changes are noted in the right mid abdomen
consistent with the recent surgery. No postoperative abscess is
noted.

3 cm simple appearing cyst in the right ovary. No follow-up imaging
recommended. Note: This recommendation does not apply to
premenarchal patients and to those with increased risk (genetic,
family history, elevated tumor markers or other high-risk factors)
of ovarian cancer. Reference: JACR [DATE]):[id number]

## 2024-03-06 ENCOUNTER — Other Ambulatory Visit: Payer: Self-pay | Admitting: Pulmonary Disease

## 2024-03-06 DIAGNOSIS — E8801 Alpha-1-antitrypsin deficiency: Secondary | ICD-10-CM

## 2024-03-09 ENCOUNTER — Ambulatory Visit
Admission: RE | Admit: 2024-03-09 | Discharge: 2024-03-09 | Disposition: A | Discharge status: Home / Self Care | Source: Ambulatory Visit | Attending: Pulmonary Disease | Admitting: Pulmonary Disease

## 2024-03-09 DIAGNOSIS — E8801 Alpha-1-antitrypsin deficiency: Secondary | ICD-10-CM | POA: Diagnosis present

## 2025-02-01 ENCOUNTER — Other Ambulatory Visit: Payer: Self-pay | Admitting: Orthopedic Surgery

## 2025-02-01 DIAGNOSIS — M5412 Radiculopathy, cervical region: Secondary | ICD-10-CM
